# Patient Record
Sex: Female | Born: 1935 | Race: White | Hispanic: No | Smoking: Never smoker
Health system: Southern US, Community
[De-identification: ages and names within clinical notes are randomized; demographics above are authoritative.]

## PROBLEM LIST (undated history)

## (undated) DIAGNOSIS — R222 Localized swelling, mass and lump, trunk: Secondary | ICD-10-CM

## (undated) DIAGNOSIS — C801 Malignant (primary) neoplasm, unspecified: Secondary | ICD-10-CM

## (undated) DIAGNOSIS — N2889 Other specified disorders of kidney and ureter: Secondary | ICD-10-CM

## (undated) DIAGNOSIS — I499 Cardiac arrhythmia, unspecified: Secondary | ICD-10-CM

## (undated) DIAGNOSIS — I10 Essential (primary) hypertension: Secondary | ICD-10-CM

## (undated) HISTORY — PX: TONSILLECTOMY: SUR1361

## (undated) HISTORY — PX: APPENDECTOMY: SHX54

---

## 2002-06-05 ENCOUNTER — Other Ambulatory Visit: Admission: RE | Admit: 2002-06-05 | Discharge: 2002-06-05 | Payer: Self-pay | Admitting: Obstetrics and Gynecology

## 2005-01-19 ENCOUNTER — Observation Stay (HOSPITAL_COMMUNITY): Admission: EM | Admit: 2005-01-19 | Discharge: 2005-01-20 | Payer: Self-pay | Admitting: Emergency Medicine

## 2005-01-19 ENCOUNTER — Encounter (INDEPENDENT_AMBULATORY_CARE_PROVIDER_SITE_OTHER): Payer: Self-pay | Admitting: *Deleted

## 2007-09-29 DIAGNOSIS — I499 Cardiac arrhythmia, unspecified: Secondary | ICD-10-CM

## 2007-09-29 HISTORY — DX: Cardiac arrhythmia, unspecified: I49.9

## 2008-08-15 ENCOUNTER — Inpatient Hospital Stay (HOSPITAL_COMMUNITY): Admission: EM | Admit: 2008-08-15 | Discharge: 2008-08-20 | Payer: Self-pay | Admitting: Emergency Medicine

## 2008-08-15 ENCOUNTER — Ambulatory Visit: Payer: Self-pay | Admitting: Internal Medicine

## 2008-08-16 ENCOUNTER — Ambulatory Visit: Payer: Self-pay | Admitting: Surgery

## 2008-08-16 ENCOUNTER — Encounter (INDEPENDENT_AMBULATORY_CARE_PROVIDER_SITE_OTHER): Payer: Self-pay | Admitting: Internal Medicine

## 2008-09-03 ENCOUNTER — Ambulatory Visit: Payer: Self-pay | Admitting: Internal Medicine

## 2008-09-18 ENCOUNTER — Ambulatory Visit: Payer: Self-pay | Admitting: Internal Medicine

## 2008-09-18 LAB — CONVERTED CEMR LAB
ALT: 15 units/L (ref 0–35)
Albumin: 3.9 g/dL (ref 3.5–5.2)
Alkaline Phosphatase: 68 units/L (ref 39–117)
BUN: 13 mg/dL (ref 6–23)
Calcium: 9.3 mg/dL (ref 8.4–10.5)
Direct LDL: 98.2 mg/dL
GFR calc Af Amer: 127 mL/min
Potassium: 4.4 meq/L (ref 3.5–5.1)
Total CHOL/HDL Ratio: 3.9
Triglycerides: 232 mg/dL (ref 0–149)
VLDL: 46 mg/dL — ABNORMAL HIGH (ref 0–40)

## 2008-09-24 ENCOUNTER — Inpatient Hospital Stay (HOSPITAL_COMMUNITY): Admission: RE | Admit: 2008-09-24 | Discharge: 2008-09-25 | Payer: Self-pay | Admitting: Internal Medicine

## 2008-09-24 ENCOUNTER — Ambulatory Visit: Payer: Self-pay | Admitting: Internal Medicine

## 2008-09-24 HISTORY — PX: ABLATION OF DYSRHYTHMIC FOCUS: SHX254

## 2008-10-24 ENCOUNTER — Ambulatory Visit: Payer: Self-pay | Admitting: Internal Medicine

## 2009-06-02 DIAGNOSIS — R002 Palpitations: Secondary | ICD-10-CM | POA: Insufficient documentation

## 2009-06-02 DIAGNOSIS — I10 Essential (primary) hypertension: Secondary | ICD-10-CM | POA: Insufficient documentation

## 2009-06-02 DIAGNOSIS — I498 Other specified cardiac arrhythmias: Secondary | ICD-10-CM

## 2011-02-10 NOTE — Assessment & Plan Note (Signed)
[facility]                         ELECTROPHYSIOLOGY OFFICE NOTE   VALMA, ROTENBERG                       MRN:          [id number]  DATE:10/24/2008                            DOB:          Mar 09, 1935    Ms. Hardigree returns today for followup.  She is a very pleasant 76-year-  old woman with a history of hypertension and SVT who was subsequently  found at EP study to have a concealed left postural septal accessory  pathway contributing to her SVT.  This was successfully ablated  utilizing transseptal catheterization several weeks ago.  She returns  today for followup.  She has had no recurrent SVT though she does admit  to some palpitations particularly if she has had too much coffee to  drink.  The patient's blood pressure in our office is somewhat elevated  and has been in the past, but she states that she takes it at home and  at her sister's house where it is normal.  No other complaints today  except for occasional palpitations associated with her increased  caffeine intake.   The patient is on no medications at the present time.   Her physical exam is notable for her being a pleasant, well-appearing  woman in no distress.  Blood pressure was 160/90, the pulse was 80 and  regular, the respirations were 18, the weight was 152 pounds.  Neck  revealed no jugular distention.  Lungs are clear bilaterally to  auscultation.  No wheezing, rales, or rhonchi are present.  There is no  increased work of breathing.  Cardiovascular exam revealed a regular  rate and rhythm.  Normal S1 and S2.  There are no murmurs, rubs, or  gallops.  Abdominal exam is soft and nontender.  Extremities  demonstrated no edema.   EKG demonstrates sinus rhythm.  Normal axis and intervals.   IMPRESSION:  1. Supraventricular tachycardia status post successful catheter      ablation.  2. Rare palpitations associated with the caffeine intake, but with no      symptoms like  she had prior to her ablation.  3. Hypertension which is predominantly white coat related according to      the patient.   DISCUSSION:  Overall, Ms. Momon is stable.  I have recommended no  additional followup at this time in our EP office.  I will see the  patient back as needed.  I have encouraged her to reduce her salt intake  and seek followup if her blood pressure becomes more elevated.     Doylene Canning. Ladona Ridgel, MD  Electronically Signed    GWT/MedQ  DD: 10/24/2008  DT: 10/24/2008  Job #: [id number]

## 2011-02-10 NOTE — Op Note (Signed)
NAMESARI, Alicia Richardson                ACCOUNT NO.:  [id number]   MEDICAL RECORD NO.:  [id number]          PATIENT TYPE:  INP   LOCATION:  6525                         FACILITY:  [facility]   PHYSICIAN:  Doylene Canning. Ladona Ridgel, MD    DATE OF BIRTH:  May 03, 1935   DATE OF PROCEDURE:  08/17/2008  DATE OF DISCHARGE:                               OPERATIVE REPORT   PROCEDURE PERFORMED:  Electrophysiologic study and radiofrequency  catheter ablation of a concealed left posterior accessory pathway.  The  procedure was unsuccessful in getting rid of the accessory pathway and  inducible supraventricular tachycardia.   INTRODUCTION:  The patient is 76 years old.  She has had a several week  history of tachy palpitations and documented SVT associated with  syncope.  The patient was in her usual state of health when she  presented with a syncopal episode and was found to be in SVT at 180  beats per minute.  She had recurrent SVT despite medical therapy and is  now referred for catheter ablation of her SVT.   PROCEDURE:  After informed consent was obtained, the patient was taken  to the Diagnostic EP Lab in a fasting state.  After usual preparation  and draping, intravenous fentanyl and midazolam were given for sedation.  A 6-French hexapolar catheter was inserted percutaneously into the right  jugular vein and advanced to the coronary sinus.  A 5-French quadripolar  catheter was inserted percutaneously in the right femoral vein and  advanced to the RV apex.  A 5-French quadripolar catheter was inserted  percutaneously in the right femoral vein and advanced under fluoroscopic  guidance to the His bundle region.  After measurement with the catheter  manipulation, the patient developed sustained SVT.  PVCs replaced during  tachycardia at the time of His bundle refracture, which very clearly pre-  excited the atrium.  During ventricular pacing, there was nondecremental  VA conduction, which was eccentric.   Mapping of the atrial activation  sequence, both during tachycardia as well as with ventricular pacing  demonstrated that the earliest atrial activation was in a location  approximately 5 o'clock on the mitral valve annulus.  The activation  sequence was very clearly bracketed.  Over the next 3 hours, both B as  well as D-curve Cordis-Webster ablation catheter as well as a standard  curve EPT bidirectional catheter were utilized to attempt to ablate the  left posterior accessory pathway.  It should be noted that the atrial  insertion of the pathway could not be reached after multiple mapping  attempts.  As the catheter approached the atrial insertion of the  pathway, the catheter would dislodge off the mitral annulus and back  into the right ventricle and then out into the aorta.  Multiple attempts  at mapping the atrial insertion of the tachycardia were carried out both  by placing the ablation catheter farther into the left atrium and trying  to pull it back as well as trying to manipulate the catheter more  proximally along the mitral annulus and coronary sinus, but  unfortunately despite multiple attempts on  the atrial insertion of the  pathway, successful ablation could not be carried out.  Attempts to map  the ventricular insertion of the pathway were then carried out.  The  ablation catheter was able to get right on top of ventricular insertion  of the pathway, but with RF energy application on the ventricular  insertion, the power level could never be greater than 2 or 3 Watts.  This resulted in high temperatures and no clear-cut heating of tissue.  Despite multiple attempts on the ventricular insertion, accessory  pathway conduction persisted.  Finally, the ablation catheter was  maneuvered in the coronary sinus os and RF energy application was  carried out in the coronary sinus along the coronary sinus os.  This  demonstrated no accessory pathway block and no evidence of  successful  ablation.  At this point, the catheters were removed.  It was deemed  most appropriate at this point, a transseptal catheterization at a later  date would be most appropriate, and because the patient was already  therapeutically anticoagulated with heparin, it was deemed not  appropriate to proceed at this juncture secondary to the patient's  underlying anticoagulation.  At this point, the catheters were removed,  hemostasis was assured, and the patient was returned to her room in  satisfactory condition.   COMPLICATIONS:  There were no immediate procedure complications.   RESULTS:  a.  Baseline ECG.  The baseline ECG demonstrates sinus rhythm  with normal axis and intervals.  There was no ventricular pre-  excitation.  b.  Baseline intervals.  The HV interval was 45 milliseconds.  The QRS  duration was 90 milliseconds.  The AH interval was 95 seconds.  The QT  interval was 400 milliseconds.  b.  Rapid ventricular pacing.  Rapid ventricular pacing from the RV apex  demonstrated inducible tachycardia.  VA Wenckebach was demonstrated with  ventricular pacing at 310 milliseconds.  During ventricular pacing, the  atrial activation was eccentric and nondecremental.  d.  Programmed ventricular simulation.  Programmed ventricular  stimulation demonstrated nondecremental VA conduction, which was  eccentric.  e.  Rapid atrial pacing.  Rapid atrial pacing was carried out from the  coronary sinus and the high right atrium pace cycle length down to 310  milliseconds demonstrating AV Wenckebach.  During rapid atrial pacing,  the PR interval was less than the R interval, but there was inducible  SVT.  f.  Programmed atrial stimulation.  Programmed atrial stimulation was  carried out from the coronary sinus demonstrating no AH jumps, no echo  beats.  There was inducible tachycardia, however.  g.  Arrhythmias observed.  AV reentry tachycardia initiation was with  spontaneous as well as  with rapid atrial pacing.  Duration was  sustained.  Termination was with ventricular pacing and catheter  manipulation.  The cycle length was approximately 330 milliseconds.  h.  Mapping.  Mapping of the patient's tachycardia demonstrated earliest  atrial activation at approximately 5 o'clock on the mitral annulus.  1. RF energy application.  A total of 19 RF energy applications      delivered to the atrial and ventricular insertions of the      tachycardia.  Unfortunately, successful ablation could not be      carried out.   CONCLUSION:  Study demonstrates unsuccessful catheter ablation after  electrophysiologic study demonstrated a left posterior accessory pathway  and inducible AV reentry tachycardia.  The patient's procedure was  uncomplicated.  The patient is being placed on  flecainide and Lopressor  initially with plans to repeat catheter ablation utilizing a transseptal  catheterization with RF energy applications to the atrial insertion of  the tachycardia at a time in several weeks from now.      Doylene Canning. Ladona Ridgel, MD  Electronically Signed     GWT/MEDQ  D:  08/17/2008  T:  08/18/2008  Job:  [id number]

## 2011-02-10 NOTE — Letter (Signed)
October 24, 2008    L. Lupe Carney, M.D.  [address] [place], [place] [address]   RE:  EMERALD, SHOR  MRN:  [id number]  /  DOB:  [birth year]   Dear Dr. Clovis Riley:   This is a letter in followup on a mutual patient of ours, Ms. Alicia Richardson.  The patient is very pleasant 76 year old woman with a history  of tachy-palpitations and documented SVT.  I saw her initially back in  November to December range and she actually had syncope with her SVT.  We elected to undergo electrophysiologic study and catheter ablation at  that time and she was at that time found to have a very difficult  accessory pathway when approached by way of the aortic valve region.  In  attempts to a successfully ablate the tachycardia, which was located  along the mitral annulus (she has an accessory pathway there).  We were  unsuccessful by way of the retrograde aortic approach.  Having  demonstrated this, we had her come back in several weeks ago where she  underwent successful catheter ablation by way of a transseptal approach  crossing the right atrium into the left atrium by percutaneous puncture  of the atrial septum with advancement of her ablation catheter on to the  mitral annulus from this approach.  She underwent successful catheter  ablation.  She returns today for followup.  The patient has done well  since her ablation.  She notes that if she takes too much caffeine in  the way of drinking too much coffee she will feel palpitations, but she  has had no sustained tachy-palpitations or heart racing.   At this point, the likelihood of [name]'s SVT returning is quite  low.  I have recommend a period of watchful waiting.  While, I have not  scheduled her for followup visit.  I will be happy to see her back at  your discretion if you thinks it is appropriate.  Please do not hesitate  to contact me for any additional questions regarding [name].    Sincerely,      Doylene Canning. Ladona Ridgel,  MD  Electronically Signed    GWT/MedQ  DD: 10/24/2008  DT: 10/25/2008  Job #: [id number]

## 2011-02-10 NOTE — H&P (Signed)
Alicia Richardson, DEMARTINI                ACCOUNT NO.:  [id number]   MEDICAL RECORD NO.:  [id number]          PATIENT TYPE:  INP   LOCATION:  3703                         FACILITY:  [facility]   PHYSICIAN:  Peggye Pitt, M.D. DATE OF BIRTH:  [birth year]   DATE OF ADMISSION:  08/15/2008  DATE OF DISCHARGE:                              HISTORY & PHYSICAL   PRIMARY CARE PHYSICIAN:  She does not have one.  That makes her  unassigned to [place].   CHIEF COMPLAINT:  Syncope.   HISTORY OF PRESENT ILLNESS:  Mrs. Stoermer is a very young and healthy-  appearing 76 year old Caucasian woman, who does not receive any regular  medical care, who was with a friend this afternoon at a shopping center  leaning against a wall when she suddenly felt some fluttering in her  chest and then lost consciousness and slid down the wall, hitting her  right hip.  Her friend report this episode of loss of consciousness  lasted for about two minutes.  She then woke up and has had no further  symptoms since then.  She denies any chest pains, any shortness of  breath, or any other symptoms..  For this reason we were called for  admission.   ALLERGIES:  She has an intolerance to PENICILLIN which causes a yeast  infection.  I believe penicillin should be erased from her allergy list.   PAST MEDICAL HISTORY:  None.   HOME MEDICATIONS:  None.   SOCIAL HISTORY:  She denies any alcohol, tobacco, or illicit drug use.  She is divorced.  Retired.   FAMILY HISTORY:  Very positive for coronary artery disease with a  brother having a fatal MI in his 26s and both mother and father with  ischemic cardiomyopathy and Mis,.although she is not sure at what age  her parents had their first myocardial infarctions.   REVIEW OF SYSTEMS:  Negative except as per HPI.   PHYSICAL EXAMINATION:  VITAL SIGNS:  Upon admission blood pressure  182/96, heart rate 96, respirations 23, O2 saturations 96% on room air  with a temperature of 98.1.  GENERAL:  She is alert, awake, oriented x3.  Does not appear to be in  any acute distress.  HEENT:  Normocephalic, atraumatic.  Her pupils are equal, reactive to  light, and accommodation with intact extraocular movements.  NEC;  Supple.  No JVD.  No lymphadenopathy.  No bruits or goiter.  HEART:  Regular rate and rhythm with no murmurs, rubs, or gallops.  LUNGS:  Clear to auscultation bilaterally.  ABDOMEN:  Soft, nontender, nondistended with positive bowel sounds.  EXTREMITIES:  No edema.  Positive pulses.  NEUROLOGIC:  Her mental status is intact.  Her cranial nerves II-XII are  intact.  Muscle strength is 5/5 bilaterally and symmetric.  DTRs are 2+  and symmetric.  Her sensation is intact to light touch.  Her Babinski is  downgoing bilaterally.  I have not ambulated her.  Finger-to-nose is  normal.   LABORATORY DATA:  Upon admission sodium 143, potassium 4.4, chloride  106, bicarb 28, BUN 12, creatinine 0.82, glucose  of 118.  WBC 8.6,  hemoglobin 13.7, and platelets 191.  Her first set of point of care  markers is negative.   Chest x-ray shows no acute distress.  A CT of head without contrast  shows no acute abnormality.   ASSESSMENT/PLAN:  1. Her syncopal event which at this time to me is most concerning for      cardiac etiology, especially an arrhythmia such as paroxysmal      supraventricular tachycardia.  Will admit to telemetry.  Will cycle      EKGs and cardiac enzymes.  I doubt this is a CVA, given absence of      focal findings.  No seizure activity was noted by her friend.  Will      also check a UDS and a TSH.  Will also check a 2-D echo to rule out      aortic stenosis as a cause of the syncope.  Will also risk stratify      with fasting lipid panel and a hemoglobin A1c.  2. For prophylaxis while in the hospital the patient will have      Protonix for GI prophylaxis and on Lovenox for DVT prophylaxis.      Peggye Pitt, M.D.  Electronically Signed      EH/MEDQ  D:  08/15/2008  T:  08/16/2008  Job:  [id number]

## 2011-02-10 NOTE — Discharge Summary (Signed)
Alicia Richardson, Alicia Richardson                ACCOUNT NO.:  [id number]   MEDICAL RECORD NO.:  [id number]          PATIENT TYPE:  INP   LOCATION:  6525                         FACILITY:  [facility]   PHYSICIAN:  Doylene Canning. Ladona Ridgel, MD    DATE OF BIRTH:  11-29-1934   DATE OF ADMISSION:  08/15/2008  DATE OF DISCHARGE:  08/20/2008                               DISCHARGE SUMMARY   ALLERGIES:  This patient has an intolerance to PENICILLIN.   FINAL DIAGNOSES:  1. Admitted with supraventricular tachycardia with syncope.  2. Electrophysiology study on August 17, 2008.      a.     Inducible atrioventricular reentrant tachycardia.      b.     At electrophysiology study, left posterior accessory       pathway.      c.     Unable to terminate with radiofrequency catheter ablation.      d.     The patient will return for accessory pathway ablation using       a transseptal puncture for mapping of the left atrium where the       accessory pathway is located.  3. Started flecainide therapy and metoprolol therapy this admission.  4. Supraventricular tachycardia suppression.  Medication adjusted on      August 20, 2008, when minor fleeting breakthrough had occurred.  5. The patient goes home on flecainide 50 mg tablets 1-1/2 twice daily      and metoprolol 50 mg twice daily.  6. Dyslipidemia.  The patient will be sent home with a statin therapy      and possible follow up with fibrate (TriCor if HDL value does not      increase).  7. A 2-D echocardiogram on August 16, 2008, ejection fraction of 55-      60%, no left ventricular wall motion abnormalities, heart size is      normal.  8. Carotid duplex.  No internal carotid artery stenoses.   BRIEF HISTORY:  Ms. Willbanks is a 76 year old female.  She did not receive  any regular medical care.  She has no primary caregiver.  She has not  had any cardiac history.   She was shopping with a friend on the afternoon of August 15, 2008,  when she suddenly felt some  fluttering in her chest, her eyes widened  according to witness' report, she stiffened and then she slid down along  the wall, lost consciousness for brief seconds it seems.  Upon  awakening, she was not disoriented, she had no postictal symptoms.  She  denied chest pain or shortness of breath.  She was taken to the  emergency room for evaluation.  The patient was transferred to [facility] into the emergency room.  Cardiac enzymes were taken and  they were 0.04, then 0.01, then 0.01.  She did have a run of wide  complex tachycardia, which did spontaneously terminate and was thought  to be supraventricular with aberration, and because of this, cardiology  consult was obtained.  A 2-D echocardiogram and carotid Dopplers were  taken.  The echocardiogram showed ejection fraction 55-60%, carotid  Doppler showed no internal carotid artery stenosis.  The patient did  mention family history of myocardial infarction in a brother at a fairly  young age.  The troponin I studies were negative.  Her cholesterol was  237, LDL cholesterol 172, HDL cholesterol 39, and triglycerides 129.  On  August 17, 2008, the patient was seen by Dr. Lewayne Bunting,  Electrophysiology.  He discussed both medical therapy and ablation  therapy.  The patient has a history of syncope when in SVT and would  rather have an ablation.  This was done the same day on August 17, 2008.  At electrophysiology study, AVRT was recognized.  There was a  concealed left posterior accessory pathway.  Radiofrequency energy was  applied, but it was unable to ablate this rhythm.  The patient was then  started on medical therapy and kept over the weekend to assess the  effects of her medical therapy during the 36 hours prior to this  discharge while the patient has been on antiarrhythmic of flecainide.  She has had no ventricular arrhythmias.  She did have a mild recurrence  of her SVT in the morning of August 20, 2008, and was  perceived as a  very mild fluttering.  The episodes only lasted between 10-20 seconds  and there were only 3 of them, which self-terminated.  The patient's  medications were adjusted prior to discharge by Dr. Ladona Ridgel.   The patient was now discharged on the following medications:  1. Flecainide 50 mg tablets 1-1/2 tablets twice daily.  2. Zocor 20 mg daily at bedtime.  3. Metoprolol 50 mg twice daily.  All of these medications are new.  She has prescriptions for all of  these.   She has followup with Dr. Ladona Ridgel at [facility] [address] on Monday, September 03, 2008, at 1:15.  Discussion of a  transseptal puncture for mapping the LA and ablation of the accessory  pathway will be entertained at that time.  Complete blood count this  admission on August 19, 2008, hemoglobin 11.9, hematocrit 35.3, white  cell 11.1, platelets of 152.  Serum electrolytes on August 20, 2008,  sodium 143, potassium 3.7, chloride 109, carbonate 27, BUN is 13,  creatinine is 0.75, glucose is 92.  Liver function panel this admission  alkaline phosphatase 53, SGOT is 17, SGPT is 14.  Magnesium this  admission is 2.2.  Urinalysis is negative.  TSH is 3.640.  Hgb A1c is  5.8.   Once again, the patient will follow up with Dr. Ladona Ridgel on Thursday,  September 03, 2008.      Maple Mirza, PA      Doylene Canning. Ladona Ridgel, MD  Electronically Signed    GM/MEDQ  D:  08/20/2008  T:  08/20/2008  Job:  [id number]

## 2011-02-10 NOTE — Discharge Summary (Signed)
Alicia Richardson, Alicia Richardson                ACCOUNT NO.:  [id number]   MEDICAL RECORD NO.:  [id number]          PATIENT TYPE:  INP   LOCATION:  4733                         FACILITY:  [facility]   PHYSICIAN:  Doylene Canning. Ladona Ridgel, MD    DATE OF BIRTH:  [birth year]   DATE OF ADMISSION:  09/24/2008  DATE OF DISCHARGE:  09/25/2008                               DISCHARGE SUMMARY   FINAL DIAGNOSES:  1. Recurrent supraventricular tachycardia with syncope.  2. 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  3. Attempted radiofrequency catheter ablations on August 17, 2008,      utilizing the retrograde aortic approach, this was unsuccessful.  4. Radiofrequency catheter ablation using transseptal approach with      echocardiographic guidance through the fossa ovalis/confirmed      placement by venography.  Radiofrequency catheter ablation placed      at 5:30 on the mitral valve annulus terminating supraventricular      tachycardia this admission.  5. A 2-D echocardiogram, this study was done on August 16, 2008,      ejection fraction 55-60%.  No left ventricular regional wall motion      abnormalities.  6. Strong family history of coronary artery disease.  A brother had a      myocardial infarction at a fairly young age.  7. Mild dyslipidemia.  8. Fatigue, on metoprolol.   PROCEDURE:  On September 24, 2008, electrophysiology study,  radiofrequency catheter ablation of SVT using transseptal approach with  echocardiographic guidance and venography.  Multiple radiofrequency  applications were delivered in the atrial region on the left side and  final termination was obtained by maneuvering the catheterization into  the ventricular insertion of the pathway.  Radiofrequency catheter  ablation was applied at the ventricular side of the accessory pathway in  the LAO projection.  There was no persistence of accessory pathway  conduction and no inducible SVT after delivery of radiofrequency  catheter energy.   BRIEF HISTORY:  Alicia Richardson is a 76 year old female.  She has a history  of tachy palpitations.  She has SVT which was associated with syncope in  the past.  She underwent an attempted radiofrequency catheter ablation  for this in November 2009.  As mentioned above, a retrograde aortic  approach was attempted and this was unsuccessful.  The patient was  maintained on medical therapy, but has continuing fatigue on metoprolol  and some recurrence.  These recurrences have been short lived.  However,  the patient had been fatigued and weak with medication therapy which  included metoprolol 50 mg twice daily and flecainide 75 mg twice daily.  The patient wishes to pursue followup ablation if possible.  This could  involve a transseptal approach with intracardiac echocardiographic  guidance.  The risks, benefits, goals, and expectations of the procedure  have been discussed with the patient and she wishes to proceed.   HOSPITAL COURSE:  The patient presents electively on September 24, 2008.  She underwent a transseptal approach with successful ablation of the  concealed  left posterior accessory pathway.  The patient has had no  recurrence of SVT after the procedure.  She is able to discharge without  resuming flecainide and she will discontinue metoprolol 50 mg twice  daily, either tapering off gently or stopping altogether depending on  how she feels.  This was an option given to her by Dr. Ladona Ridgel.  She then  continues on simvastatin 20 mg daily at bedtime.  She has an office  visit with Dr. Ladona Ridgel on Wednesday, October 24, 2008, at 8:45 at [facility] office.  Laboratory studies for this admission  were drawn on September 18, 2008, sodium 142,  potassium 4.4, chloride 107, carbonate 30, glucose 97, BUN is 13, and  creatinine 0.6.  Total cholesterol is 175, triglycerides is 232, HDL   cholesterol 46, and LDL cholesterol 98.  Complete blood count is not  available on the chart at this time.      Maple Mirza, PA      Doylene Canning. Ladona Ridgel, MD  Electronically Signed    GM/MEDQ  D:  09/25/2008  T:  09/25/2008  Job:  [id number]   cc:   Doylene Canning. Ladona Ridgel, MD

## 2011-02-10 NOTE — Assessment & Plan Note (Signed)
[facility]                         ELECTROPHYSIOLOGY OFFICE NOTE   Alicia Richardson, Alicia Richardson                       MRN:          [id number]  DATE:09/03/2008                            DOB:          1935-08-03    Alicia Richardson returns today for followup.  She is a very pleasant 76-year-  old woman with a history of recurrent SVT associated with syncope.  The  patient underwent electrophysiologic study and attempted catheter  ablation on August 17, 2008.  At that time, she was found to have a  left posterior accessory pathway and unusual anatomy and it was very  difficult to cross the aortic valve (no history of aortic stenosis) and  attempts to ablate her pathway were unsuccessful.  Ablation attempts  using the retrograde aortic approach with placement of a catheter onto  the left atrial insertion, the pathway resulted in the catheter  dislodging right at the site of early atrial activation and inability to  maintain catheter contact with the pathway from the atrial insertion.  From the ventricular insertion, placement of the catheter was also quite  difficult, but once the catheter had been placed right on the pathway,  it was essentially continuous VA activation during tachycardia.  RF  energy application failed to terminate the tachycardia as the power  output on the ablation catheter was only 1 or 2 watts and effective  energy could not be delivered to the tissue.  It was deemed most likely  that this was related to low blood flow state in the area under the  mitral annulus.  Irrigation was not available during the procedure at  this time.  With these results and after several hours of attempts with  multiple catheters, the decision to terminate the procedure was made and  the patient was discharged the following day on beta-blockers and  flecainide at low dose.  Since then, she has been stable, having only a  couple of episodes of SVT which were short  lived.  Unfortunately, she  notes that she feels fatigue and weak on the medications.  She returns  today for followup and wishes to proceed with redo catheter ablation.   CURRENT MEDICATIONS:  1. Flecainide 75 mg twice daily.  2. Metoprolol 50 mg twice daily.  3. Simvastatin 20 mg daily.   PHYSICAL EXAMINATION:  GENERAL:  She is a pleasant well-appearing, 76-  year-old woman in no distress.  VITAL SIGNS:  Blood pressure was 150/80, the pulse is 57 and regular,  the respirations were 18, and the weight was 152 pounds.  NECK:  No jugular vein distention.  LUNGS:  Clear bilaterally to auscultation.  No wheezes, rales, or  rhonchi present.  CARDIOVASCULAR:  Regular rate and rhythm.  Normal S1 and S2.  ABDOMEN:  Soft and nontender.  EXTREMITIES:  No edema.   EKG demonstrates sinus rhythm (sinus bradycardia with normal axis and  intervals).  QRS duration was 100 milliseconds.   IMPRESSION:  1. Recurrent supraventricular tachycardia despite medical therapy with      the initial episodes associated with syncope.  2. Status post failed  catheter ablation attempts of a left posterior      concealed accessory pathway.   DISCUSSION:  I have discussed the treatment options with the patient.  One option would be to continue medical therapy.  However, she is not in  favor of this because of the fatigue and weakness associated with  medical therapy.  Second option would be redo catheter ablation  utilizing a transseptal approach with intracardiac echo guidance.  The  risks, benefits, goals, and expectations of the procedure have been  discussed with the patient.  She would like to proceed with catheter  ablation.  This was scheduled as early as possible convenient time.     Doylene Canning. Ladona Ridgel, MD  Electronically Signed    GWT/MedQ  DD: 09/03/2008  DT: 09/04/2008  Job #: [id number]

## 2011-02-10 NOTE — Op Note (Signed)
Alicia Richardson, DISCH                ACCOUNT NO.:  [id number]   MEDICAL RECORD NO.:  [id number]          PATIENT TYPE:  INP   LOCATION:  4733                         FACILITY:  [facility]   PHYSICIAN:  Doylene Canning. Ladona Ridgel, MD    DATE OF BIRTH:  11-26-1934   DATE OF PROCEDURE:  09/24/2008  DATE OF DISCHARGE:                               OPERATIVE REPORT   PROCEDURE PERFORMED:  Electrophysiologic study and radiofrequency  catheter ablation of atrioventricular reentrant tachycardia utilizing a  concealed left posteroseptal accessory pathway utilizing intracardiac  echocardiogram and transseptal catheterization.   INTRODUCTION:  The patient is a 76 year old, has a history of  tachypalpitations and documented SVT associated with syncope.  She  underwent attempted catheter ablation and several weeks ago was found to  have a concealed left posteroseptal accessory pathway.  Because of  anatomic constraints, the patient's pathway could not be successfully  ablated.  The patient's pathway was targeted by the retrograde aortic  approach.  The patient was placed on flecainide initially and is now  referred for repeat catheter ablation.   PROCEDURE:  After informed consent was obtained, the patient was taken  to the diagnostic EP lab in the fasting state.  After usual preparation  and draping, intravenous fentanyl and midazolam was given for sedation.  A 6-French quadripolar catheter was inserted percutaneously into the  left femoral vein and advanced to the right ventricle.  An 11-French  intracardiac echo catheter was inserted percutaneously through the left  femoral vein and advanced to the right atrium.  A 6-French hexapolar  catheter was inserted percutaneously into the right femoral vein and  advanced to the His-bundle region.  After measurement of basic  intervals, rapid ventricular pacing was carried out demonstrating  eccentric atrial activation and inducible SVT.  Additional pacing  demonstrated a VA Wenckebach cycle length of 360 milliseconds.  Following this, programmed stimulation was carried out from the coronary  sinus demonstrating midline ventricular activation and inducible SVT.  Cycle length of tachycardia was approximately 400 milliseconds.  During  tachycardia, PVCs were placed at the time of His-bundle refractures  demonstrating preexcitation of the atrium and ventricular pacing  demonstrated a AV conduction sequence.  With reconfirmation of  persistence of the patient's left posteroseptal accessory pathway, the 8-  French transseptal catheter (SL2) along with a Brockenbrough needle were  advanced into the right atrium.  Utilized intracardiac echo guidance,  the transseptal catheter was placed onto the fossa ovalis of the right  atrium and successful transseptal catheterization was carried out.  With  venography of the left atrium demonstrating that the catheter was in  fact in place.  The 8-French sheath was subsequently advanced into the  left atrium over the Brockenbrough needle and the 7-French quadripolar  ablation catheter was subsequently advanced by way of the transseptal  sheath into the left atrium.  If she noted that with successful  transseptal catheterization, 5000 units of heparin was injected through  the transseptal sheath.  Following this, repeat ACTs were obtained  during the procedure and heparin was infused as deemed appropriate.  Typically, the  ACT range between 250 and 300 during the procedure.  At  this point, ventricular pacing was carried out at 500 milliseconds  demonstrating persistence of eccentric activation.  This is the earliest  at a position approximately 500-530 in the LAO projection along the  mitral valve annulus.  The quadripolar ablation catheter was then  advanced first onto the atrial insertion and mapping was carried out  along the mitral valve annulus.  Multiple RF energy applications were  delivered and during RF  energy application there were be transient VA  dissociation indicative of successful ablation.  However, unfortunately  with RF energy application discontinued, the patient's accessory pathway  conduction would immediately returned.  At this point, the ablation  catheter was maneuvered onto the ventricular insertion of the pathway.  Mapping was made more difficult because there was such a small atrial  signal with the catheter on the ventricular insertion.  However, on the  ventricular side of the accessory pathway in the LAO projection, a  single RF energy application was delivered despite a very small atrial  signal and there was immediate VA dissociation present with RF energy  application.  The RF energy application was then carried out for a total  of 2 minutes in this location and the patient was observed for 20  minutes.  During this time, there was no longer any persistence of  accessory pathway conduction and no inducible SVT.  The catheters were  removed, the sheaths were secured and the patient was taken to the  holding area for sheath removal once the ACT dropped below 175 seconds.   COMPLICATIONS:  There no immediate procedure complications.   RESULTS:  A. Baseline ECG.  Baseline ECG demonstrates sinus rhythm with  normal axis and intervals.  B.  Baseline intervals.  The sinus node cycle length was 900  milliseconds.  The HV interval was 46 milliseconds.  The QRS duration  was 98 milliseconds.  The following ablation, there were no significant  changes in these intervals.  C.  Rapid ventricular pacing.  Rapid atrial pacing after ablation  demonstrated VA dissociation.  Prior to ablation, rapid atrial pacing  demonstrated eccentric, non-decremental atrial activation.  D.  Programmed ventricular stimulation.  Programmed ventricular  stimulation was carried out at base drive cycle length of 161  milliseconds with the S1-S2 interval stepwise decreased down to 400  milliseconds  resulting an initiation of SVT.  Following ablation,  programmed ventricular stimulation demonstrated VA dissociation.  E.  Rapid atrial pacing.  Rapid pacing was carried out from the coronary  sinus and high right atrium base drive cycle length of 096 milliseconds  and stepwise decreased to 400 milliseconds where AV Wenckebach was  observed.  During rapid atrial pacing, the PR interval was less than the  RR interval and there was no inducible SVT after ablation.  F.  Programmed atrial stimulation.  Programmed atrial stimulation was  carried out from the coronary sinus and high right atrium base drive  cycle length of 045 milliseconds.  The S1-S2 interval stepwise decreased  down to 340 milliseconds where the AV node ERP was observed.  During  programmed atrial stimulation, there were no AH jumps, no echo beats, no  inducible SVT following ablation.  G.  Arrhythmias observed.  1. AV reentrant tachycardia initiation was with rapid atrial and      ventricular pacing as well as spontaneous.  The duration was      sustained.  The termination was spontaneous.  a.     Mapping.  Mapping of the patient's accessory pathway       conduction demonstrated the accessory pathway to be at the       location about 5:30 on the mitral valve annulus in the LAO       projection.  This was a successful site of catheter ablation.      b.     RF energy application.  Total of 8 RF energy applications       were delivered to the atrial as well as ventricular insertion of       the accessory pathway resulting in the pathway being rendered       nonfunctional and resulting in [place] dissociation and no inducible       SVT following ablation.   CONCLUSION:  This study demonstrates successful electrophysiologic study  and RF catheter ablation of AV reentrant tachycardia utilizing a left  posteroseptal accessory pathway, utilizing intracardiac echo and  transseptal catheterization.  As previous attempt utilizing  retrograde  aortic approach had been unsuccessful.      Doylene Canning. Ladona Ridgel, MD  Electronically Signed     GWT/MEDQ  D:  09/24/2008  T:  09/25/2008  Job:  [id number]

## 2011-02-10 NOTE — Consult Note (Signed)
Alicia Richardson, TWEED NO.:  [id number]   MEDICAL RECORD NO.:  [id number]          PATIENT TYPE:  INP   LOCATION:  3703                         FACILITY:  [facility]   PHYSICIAN:  [name], MD DATE OF BIRTH:  19-Apr-1935   DATE OF CONSULTATION:  08/16/2008  DATE OF DISCHARGE:                                 CONSULTATION   REQUESTING:  Dr. Orvan Falconer.   She has no cardiologist.   CHIEF COMPLAINT:  Syncope.   HISTORY OF PRESENT ILLNESS:  This is a 77 year old white female with no  past medical history here with syncope.  The patient states that she was  walking out the mall when she felt palpitations and dizziness and was  witnessed to collapse.  She woke up without symptoms including chest  pain.  She denies any previous episodes of syncope.  She has otherwise  been in her usual state of health maintaining an active lifestyle.  The  patient was admitted for an evaluation for syncope and during which  telemetry recorded that a few runs of wide and narrow complex  tachycardias.  During this time she experienced palpitations, some mild  dizziness but no chest pain or shortness of breath, presyncope or  syncope.   PAST MEDICAL HISTORY:  None.   ALLERGIES:  PENICILLIN.   MEDICATIONS:  None.  Here she is taking enoxaparin prophylactic dose,  Protonix and Lopressor 12.5 twice daily.   SOCIAL HISTORY:  She is divorced.  She is retired.  She does not smoke.   FAMILY HISTORY:  Significant for early coronary disease.   REVIEW OF SYSTEMS:  All 14 systems were reviewed were negative except as  mentioned in the HPI.   PHYSICAL EXAMINATION:  Blood pressure was 166/90 at the time of the  episode, otherwise it has been in the 140s over the 80s.  Pulse is 72  and regular.  She has been afebrile.  She is satting 98% on room air.  Generally she is a 76 year old white female appearing stated age.  No  acute distress.  HEENT:  Moist mucous membranes.  Pupils equal,  round, react to light  accommodation.  Anicteric sclera.  NECK:  No jugular venous distention.  No thyromegaly.  No carotid  bruits.  CARDIOVASCULAR:  Regular rate and rhythm.  No murmurs, rubs or gallops.  LUNGS:  Clear to auscultation bilaterally.  ABDOMEN:  Nontender, nondistended.  Positive bowel sounds.  No masses.  EXTREMITIES:  No clubbing, cyanosis, edema, 2+ pulses throughout.  SKIN:  Warm, dry and intact.  No rashes.  NEURO:  Alert, oriented x3.  Cranial nerves II-XII are grossly intact.  No focal neurologic deficits.  PSYCH:  Mood and affect are appropriate.   RADIOLOGY:  She had a normal head CT and a chest x-ray showing no acute  cardiopulmonary process.  EKG immediately following her tachycardia  showed a rate of 72 with a normal sinus rhythm.  No ST or T-wave changes  suggestive of ischemia.  It is a normal EKG.   LABS:  White count is 7.3, hematocrit 37.2, platelet count is  178,  potassium is 3.8.  Her creatinine is 0.8.  TSH is 3.6.  Hemoglobin A1c  is 5.8.  Her cardiac enzymes so far have been unremarkable.  Lipid  profile:  Total cholesterol is 237, triglycerides 129, HDL 39 and LDL is  172.   ASSESSMENT/PLAN:  This is a 76 year old white female with no past  medical history here with syncope and found to have an supraventricular  tachycardia.  1. Supraventricular tachycardia.  This is very unlikely ventricular      tachycardia in the setting of known structural heart disease and no      signs of ischemia.  Her echo today showed a normal ejection      fraction and a normal left atrial size.  In this context this is      likely atrio-ventricular node reentry tachycardia with aberrancy.      She did have a run of narrow complex tachycardia which is in the      same axis as her native rhythm.  These are very short and mildly      symptomatic.  However, if this persists please start Cardizem 30 mg      every 6 hours.  We will consider further treatment such as an  atrio-      ventricular node reentry tachycardia ablation.  Otherwise, continue      monitoring on telemetry.  2. Hypertension.  She may benefit from additional blood pressure      management including hydrochlorothiazide or an angiotensin      converting enzyme inhibitor.   1. Hyperlipidemia.  Her cholesterol profile is not at goal.  I      counseled her on dietary management and if this persists then      please consider starting statin medication.      [name], MD  Electronically Signed     BHH/MEDQ  D:  08/17/2008  T:  08/17/2008  Job:  [id number]

## 2011-02-13 NOTE — Op Note (Signed)
NAMEBRONDA, Alicia Richardson NO.:  [id number]   MEDICAL RECORD NO.:  [id number]          PATIENT TYPE:  EMS   LOCATION:  ED                           FACILITY:  [facility]   PHYSICIAN:  Sandria Bales. Ezzard Standing, M.D.  DATE OF BIRTH:  02/09/1935   DATE OF PROCEDURE:  01/19/2005  DATE OF DISCHARGE:                                 OPERATIVE REPORT   PREOPERATIVE DIAGNOSIS:  Appendicitis.   POSTOPERATIVE DIAGNOSIS:  Focal ruptured appendicitis.   PROCEDURE:  Laparoscopic appendectomy.   SURGEON:  Sandria Bales. Ezzard Standing, M.D.   ANESTHESIA:  General endotracheal.   ESTIMATED BLOOD LOSS:  Minimal.   PROCEDURE:  Ms. Colton has had a 24-hour history of increasing abdominal  pain which has localized to the right lower quadrant.  She saw Dr. Arrie Aran and obtained a CT scan of her abdomen at [facility], which was  consistent with appendicitis.  She now comes in for attempted laparoscopic  appendectomy.   OPERATIVE NOTE:  The indications and potential complications were explained  to the patient.  The potential complications include but are not limited to  bleeding, infection, the possibility of open surgery or bowel injury.  She  and her sister were at her bedside when I talked to her about this, and I  think she understands this well.   OPERATION:  Patient in the supine position with the arm tucked to her side.  A Foley catheter in place.  Before I made an incision, she went into  paroxysmal atrial tachycardia/atrial flutter.  This responded to IV  medications and carotid massage, supervised by Dr. Alanda Slim.   I then prepped the abdomen with Betadine solution and sterilely draped the  abdomen.  She had a Foley catheter in place.  Her left arm was tucked.  I  went through an infraumbilical incision with sharp dissection carried down  to the abdominal cavity.  A 0 degree 10 mg laparoscope was inserted.  The  right and left lobes of the liver were unremarkable.  The stomach was  unremarkable.  The bowel was acutely inflamed in the right lower quadrant,  consistent with a locally perforated appendicitis.  These adhesions were  taken down.  There was a small amount of pus, which was obtained, and  culture sent for aerobic and anaerobic cultures.   I then placed two additional trocars in the right subcostal, a 5 mm trocar,  and a 10 mm left lower quadrant trocar.  I then grabbed the base of the  appendix.  I dissected the appendix with a harmonic scalpel down at the base  of the appendix.  I then used the blue-load of the Endo GIA 45 mm stapler  and fired this across the base of the appendix.  I then placed the appendix  in an EndoCatch bag and delivered it through the umbilicus.  I then  reinspected the appendiceal stump, which looked good.  I reinspected the  appendiceal mesentery, which looked good.  I irrigated the abdomen out with  two liters of saline, trying to get any kind of loose  debris, and actually,  I found an appendicolith, and this was removed.   After irrigating the abdomen, I then removed the trocars and turned.  There  was no bleeding of the trocar site.  The umbilical port was closed with a 0  Vicryl suture.  The skin at each site was closed with a 5-0 Monocryl suture  and painted with tincture of Benzoin, Steri-Strips, and sterilely dressed.   The patient tolerated the procedure well and was transported to the recovery  room in good condition.  Sponge and needle count were correct at the end of  the case.      DHN/MEDQ  D:  01/19/2005  T:  01/19/2005  Job:  [id number]   cc:   Lacretia Leigh. Quintella Reichert, M.D.

## 2011-02-13 NOTE — H&P (Signed)
Alicia Richardson, Alicia Richardson NO.:  [id number]   MEDICAL RECORD NO.:  [id number]          PATIENT TYPE:  EMS   LOCATION:  ED                           FACILITY:  [facility]   PHYSICIAN:  Sandria Bales. Ezzard Standing, M.D.  DATE OF BIRTH:  [birth year]   DATE OF ADMISSION:  01/19/2005  DATE OF DISCHARGE:                                HISTORY & PHYSICAL   HISTORY OF ILLNESS:  This is a 76 year old white female who is referred by  Dr. Arrie Aran from [facility], who developed abdominal pain  which began yesterday, localized to the right lower quadrant.  She had a CT  scan done today at [facility], which was consistent with appendicitis.   She denies any history of peptic ulcer disease, liver disease, pancreatic  disease, or colon disease.  She has had no prior abdominal surgery.  Again,  her pain is worsened, to be localized in the right lower quadrant.  She has  had some nausea with this.   She does not see a physician on a regular basis.   PAST MEDICAL HISTORY:   ALLERGIES:  She has no allergies.   MEDICATIONS:  She is on no medications.   PRIMARY CARE PHYSICIAN:  She actually has no regular doctor that she sees.   REVIEW OF SYSTEMS:  NEUROLOGIC:  No history of seizure or loss of  consciousness.  PULMONARY:  Does not smoke cigarettes.  No history of pneumonia or  tuberculosis.  CARDIAC:  No history of heart disease, chest pain, or hypertension.  GASTROINTESTINAL:  Again, see history of present illness.  UROLOGIC:  No kidney stones or kidney infections.  GYN:  She has never been pregnant.   PERSONAL HISTORY:  She works as a Scientist, physiological at [employer].  She is divorced and has no children.  Her sister was with her in the holding  area of the OR.   PHYSICAL EXAMINATION:  VITAL SIGNS:  Temperature is 101.2, blood pressure  173/88, pulse 120, respirations 20.  Saturations are 95%.  GENERAL:  She is a well-nourished, pleasant white female, alert and  cooperative.  HEENT:  Unremarkable.  NECK:  Supple without mass, without thyromegaly.  LUNGS:  Clear to auscultation.  HEART:  She is tachycardic without murmur.  ABDOMEN:  She has decreased bowel sounds.  She is tender with guarding in  her right lower quadrant.  I feel no abdominal mass.  She has no evidence of  hernia.  RECTAL:  I did not do a rectal exam.  EXTREMITIES:  She has good strength in all 4 extremities.  NEUROLOGIC:  Grossly intact.   X-rays were reviewed.  I did review her abdominal CT scan with Winferd Humphrey,  and it does appear to be consistent with appendicitis, though she does have  some thickened loops of her terminal ileum.   DIAGNOSIS:  Appendicitis.   PLAN:  I discussed with the patient laparoscopic removal of the appendix,  and that it usually takes 1-2 hours to do the surgery.  The risks include  bleeding, infection, perforation of the bowel, and  the possibility of open  surgery, and her hospital stay would be dependent on the degree of  inflammation of the appendix and how she does.      DHN/MEDQ  D:  01/19/2005  T:  01/19/2005  Job:  [id number]

## 2011-07-01 LAB — DIFFERENTIAL
Basophils Absolute: 0
Monocytes Absolute: 0.5
Monocytes Relative: 6

## 2011-07-01 LAB — CBC
HCT: 35.3 — ABNORMAL LOW
HCT: 37.2
MCHC: 32.3
MCV: 89.5
Platelets: 152
Platelets: 178
RBC: 4.73
RDW: 13.7

## 2011-07-01 LAB — RAPID URINE DRUG SCREEN, HOSP PERFORMED: Amphetamines: NOT DETECTED

## 2011-07-01 LAB — BASIC METABOLIC PANEL
BUN: 12
BUN: 13
CO2: 27
CO2: 27
Chloride: 109
Creatinine, Ser: 0.82
GFR calc Af Amer: >60
GFR calc non Af Amer: >60
Glucose, Bld: 104 — ABNORMAL HIGH
Glucose, Bld: 118 — ABNORMAL HIGH
Glucose, Bld: 92
Potassium: 3.6
Potassium: 3.7
Sodium: 143

## 2011-07-01 LAB — COMPREHENSIVE METABOLIC PANEL
ALT: 14
Albumin: 3.5
Alkaline Phosphatase: 53
CO2: 28
Chloride: 104
Glucose, Bld: 86
Sodium: 139

## 2011-07-01 LAB — CARDIAC PANEL(CRET KIN+CKTOT+MB+TROPI)
CK, MB: 0.9
CK, MB: 1
Relative Index: 0.6
Relative Index: 0.7
Relative Index: 0.7
Total CK: 132
Total CK: 150
Troponin I: 0.01

## 2011-07-01 LAB — URINALYSIS, ROUTINE W REFLEX MICROSCOPIC
Bilirubin Urine: NEGATIVE
Hgb urine dipstick: NEGATIVE
pH: 7

## 2011-07-01 LAB — LIPID PANEL
Cholesterol: 237 — ABNORMAL HIGH
HDL: 39 — ABNORMAL LOW
LDL Cholesterol: 172 — ABNORMAL HIGH

## 2011-07-01 LAB — TSH: TSH: 3.64

## 2011-07-01 LAB — POCT CARDIAC MARKERS: Troponin i, poc: <0.05

## 2011-07-01 LAB — HEMOGLOBIN A1C: Hgb A1c MFr Bld: 5.8

## 2011-07-01 LAB — URINE MICROSCOPIC-ADD ON

## 2011-07-03 LAB — CBC
MCHC: 32.3 g/dL (ref 30.0–36.0)
Platelets: 141 10*3/uL — ABNORMAL LOW (ref 150–400)
RBC: 4.65 MIL/uL (ref 3.87–5.11)

## 2011-07-03 LAB — APTT: aPTT: 28 seconds (ref 24–37)

## 2014-03-13 ENCOUNTER — Encounter (INDEPENDENT_AMBULATORY_CARE_PROVIDER_SITE_OTHER): Payer: Medicare HMO | Admitting: Surgery

## 2018-02-07 ENCOUNTER — Encounter (HOSPITAL_BASED_OUTPATIENT_CLINIC_OR_DEPARTMENT_OTHER): Payer: Self-pay | Admitting: *Deleted

## 2018-02-07 ENCOUNTER — Other Ambulatory Visit: Payer: Self-pay | Admitting: Surgery

## 2018-02-08 ENCOUNTER — Other Ambulatory Visit: Payer: Self-pay

## 2018-02-08 ENCOUNTER — Encounter (HOSPITAL_BASED_OUTPATIENT_CLINIC_OR_DEPARTMENT_OTHER): Payer: Self-pay | Admitting: *Deleted

## 2018-02-10 NOTE — H&P (Signed)
Alicia Richardson Documented: 02/07/2018 2:10 PM Location: [facility] Patient #: [phone] DOB: 1935-04-26 Divorced / Language: Cleophus Molt / Race: White Female   History of Present Illness (Kennet Mccort A. Ninfa Linden MD; 02/07/2018 2:49 PM) The patient is a 83 year old female who presents with a complaint of Mass. This patient is referred by Dr. Harriett Sine for evaluation of a large right upper back mass. The patient reports that the mass is been present for several years. It is now getting larger and causing her to have pain and discomfort with activity. It is also so large that she has difficulty sleeping at night secondary to discomfort. She denies trauma to the area. She denies any drainage from the area. She is otherwise without complaints.   Past Surgical History [facility], LPN; 04/09/4579 9:98 PM) Appendectomy  Tonsillectomy   Diagnostic Studies History (Hadelyn Massenburg, LPN; 3/38/2505 3:97 PM) Mammogram  >3 years ago  Allergies [facility], LPN; 6/73/4193 7:90 PM) No Known Drug Allergies [02/07/2018]:  Medication History (Hadelyn Massenburg, LPN; 2/40/9735 3:29 PM) Lisinopril (5MG  Tablet, Oral) Active. Potassium (99MG  Tablet, Oral) Active.  Family History (Hadelyn Massenburg, LPN; 06/21/2682 4:19 PM) Diabetes Mellitus  Mother. Heart Disease  Brother, Mother.  Pregnancy / Birth History Harriet Pho, LPN; 03/19/2978 8:92 PM) Age of menopause  75-50 Gravida  0 Irregular periods  Para  0  Other Problems (Hadelyn Massenburg, LPN; 10/16/4172 0:81 PM) High blood pressure     Review of Systems (Hadelyn Massenburg LPN; 4/48/1856 3:14 PM) General Not Present- Appetite Loss, Chills, Fatigue, Fever, Night Sweats, Weight Gain and Weight Loss. Skin Not Present- Change in Wart/Mole, Dryness, Hives, Jaundice, New Lesions, Non-Healing Wounds, Rash and Ulcer. HEENT Not Present- Earache, Hearing Loss, Hoarseness, Nose Bleed, Oral Ulcers,  Ringing in the Ears, Seasonal Allergies, Sinus Pain, Sore Throat, Visual Disturbances, Wears glasses/contact lenses and Yellow Eyes. Respiratory Not Present- Bloody sputum, Chronic Cough, Difficulty Breathing, Snoring and Wheezing. Breast Not Present- Breast Mass, Breast Pain, Nipple Discharge and Skin Changes. Cardiovascular Not Present- Chest Pain, Difficulty Breathing Lying Down, Leg Cramps, Palpitations, Rapid Heart Rate, Shortness of Breath and Swelling of Extremities. Gastrointestinal Not Present- Abdominal Pain, Bloating, Bloody Stool, Change in Bowel Habits, Chronic diarrhea, Constipation, Difficulty Swallowing, Excessive gas, Gets full quickly at meals, Hemorrhoids, Indigestion, Nausea, Rectal Pain and Vomiting. Female Genitourinary Not Present- Frequency, Nocturia, Painful Urination, Pelvic Pain and Urgency. Musculoskeletal Not Present- Back Pain, Joint Pain, Joint Stiffness, Muscle Pain, Muscle Weakness and Swelling of Extremities. Neurological Not Present- Decreased Memory, Fainting, Headaches, Numbness, Seizures, Tingling, Tremor, Trouble walking and Weakness. Psychiatric Not Present- Anxiety, Bipolar, Change in Sleep Pattern, Depression, Fearful and Frequent crying. Endocrine Not Present- Cold Intolerance, Excessive Hunger, Hair Changes, Heat Intolerance, Hot flashes and New Diabetes. Hematology Not Present- Blood Thinners, Easy Bruising, Excessive bleeding, Gland problems, HIV and Persistent Infections.   Physical Exam (Leaf Kernodle A. Ninfa Linden MD; 02/07/2018 2:50 PM) The physical exam findings are as follows: Note:On examination, there is a large 8 cm right upper back mass. It is soft and nonpulsatile. There are no skin changes. It is not mobile. Lungs are clear bilaterally Cardiovascular is regular rate and rhythm Generally she is well appearance.    Assessment & Plan (Coumba Kellison A. Ninfa Linden MD; 02/07/2018 2:51 PM) MASS ON BACK (R22.2) Impression: Because of the size of this mass, the  patient's symptoms, and the potential for malignancy, excision of the right upper back mass is strongly recommended. Again is approximately 8 cm in size. She would like  to be removed. I discussed the risks of surgery which includes but is not limited to bleeding, infection, injury to surrounding structures, seroma formation, the need for further surgery if malignancy is found, recurrence, postoperative recovery, etc. She understands and wishes to proceed with surgery

## 2018-02-11 ENCOUNTER — Ambulatory Visit (HOSPITAL_BASED_OUTPATIENT_CLINIC_OR_DEPARTMENT_OTHER): Payer: Medicare Other | Admitting: Anesthesiology

## 2018-02-11 ENCOUNTER — Encounter (HOSPITAL_BASED_OUTPATIENT_CLINIC_OR_DEPARTMENT_OTHER): Payer: Self-pay | Admitting: *Deleted

## 2018-02-11 ENCOUNTER — Ambulatory Visit (HOSPITAL_BASED_OUTPATIENT_CLINIC_OR_DEPARTMENT_OTHER)
Admission: RE | Admit: 2018-02-11 | Discharge: 2018-02-11 | Disposition: A | Discharge status: Home / Self Care | Payer: Medicare Other | Source: Ambulatory Visit | Attending: Surgery | Admitting: Surgery

## 2018-02-11 ENCOUNTER — Other Ambulatory Visit: Payer: Self-pay

## 2018-02-11 ENCOUNTER — Encounter (HOSPITAL_BASED_OUTPATIENT_CLINIC_OR_DEPARTMENT_OTHER)
Admission: RE | Disposition: A | Discharge status: Home / Self Care | Payer: Self-pay | Source: Ambulatory Visit | Attending: Surgery

## 2018-02-11 DIAGNOSIS — I10 Essential (primary) hypertension: Secondary | ICD-10-CM | POA: Diagnosis not present

## 2018-02-11 DIAGNOSIS — Z79899 Other long term (current) drug therapy: Secondary | ICD-10-CM | POA: Diagnosis not present

## 2018-02-11 DIAGNOSIS — R222 Localized swelling, mass and lump, trunk: Secondary | ICD-10-CM | POA: Diagnosis present

## 2018-02-11 DIAGNOSIS — M5489 Other dorsalgia: Secondary | ICD-10-CM | POA: Insufficient documentation

## 2018-02-11 HISTORY — DX: Cardiac arrhythmia, unspecified: I49.9

## 2018-02-11 HISTORY — PX: MASS EXCISION: SHX2000

## 2018-02-11 HISTORY — DX: Localized swelling, mass and lump, trunk: R22.2

## 2018-02-11 SURGERY — EXCISION MASS
Anesthesia: Monitor Anesthesia Care | Site: Back | Laterality: Right

## 2018-02-11 MED ORDER — LACTATED RINGERS IV SOLN
INTRAVENOUS | Status: DC
  Administered 2018-02-11: 10:00:00 via INTRAVENOUS

## 2018-02-11 MED ORDER — FENTANYL CITRATE (PF) 100 MCG/2ML IJ SOLN: 50.0000 ug | INTRAMUSCULAR | Status: DC | PRN

## 2018-02-11 MED ORDER — SCOPOLAMINE 1 MG/3DAYS TD PT72: 1.0000 | MEDICATED_PATCH | Freq: Once | TRANSDERMAL | Status: DC | PRN

## 2018-02-11 MED ORDER — PROPOFOL 500 MG/50ML IV EMUL
INTRAVENOUS | Status: DC | PRN
Start: 2018-02-11 — End: 2018-02-11
  Administered 2018-02-11: 50 ug/kg/min via INTRAVENOUS

## 2018-02-11 MED ORDER — MIDAZOLAM HCL 2 MG/2ML IJ SOLN: 1.0000 mg | INTRAMUSCULAR | Status: DC | PRN

## 2018-02-11 MED ORDER — MEPERIDINE HCL 25 MG/ML IJ SOLN: 6.2500 mg | INTRAMUSCULAR | Status: DC | PRN

## 2018-02-11 MED ORDER — PROMETHAZINE HCL 25 MG/ML IJ SOLN: 6.2500 mg | INTRAMUSCULAR | Status: DC | PRN

## 2018-02-11 MED ORDER — GABAPENTIN 300 MG PO CAPS
300.0000 mg | ORAL_CAPSULE | ORAL | Status: AC
  Administered 2018-02-11: 300 mg via ORAL

## 2018-02-11 MED ORDER — MIDAZOLAM HCL 2 MG/2ML IJ SOLN
INTRAMUSCULAR | Status: AC
  Filled 2018-02-11: qty 2

## 2018-02-11 MED ORDER — DEXAMETHASONE SODIUM PHOSPHATE 10 MG/ML IJ SOLN
INTRAMUSCULAR | Status: DC | PRN
  Administered 2018-02-11: 10 mg via INTRAVENOUS

## 2018-02-11 MED ORDER — BUPIVACAINE-EPINEPHRINE (PF) 0.25% -1:200000 IJ SOLN
INTRAMUSCULAR | Status: AC
  Filled 2018-02-11: qty 30

## 2018-02-11 MED ORDER — CHLORHEXIDINE GLUCONATE CLOTH 2 % EX PADS: 6.0000 | MEDICATED_PAD | Freq: Once | CUTANEOUS | Status: DC

## 2018-02-11 MED ORDER — TRAMADOL HCL 50 MG PO TABS: 50.0000 mg | ORAL_TABLET | Freq: Four times a day (QID) | ORAL | 0 refills | Status: DC | PRN

## 2018-02-11 MED ORDER — ONDANSETRON HCL 4 MG/2ML IJ SOLN
INTRAMUSCULAR | Status: DC | PRN
  Administered 2018-02-11: 4 mg via INTRAVENOUS

## 2018-02-11 MED ORDER — LIDOCAINE-EPINEPHRINE (PF) 1 %-1:200000 IJ SOLN
INTRAMUSCULAR | Status: AC
  Filled 2018-02-11: qty 30

## 2018-02-11 MED ORDER — CEFAZOLIN SODIUM-DEXTROSE 2-4 GM/100ML-% IV SOLN
2.0000 g | INTRAVENOUS | Status: AC
  Administered 2018-02-11: 2 g via INTRAVENOUS

## 2018-02-11 MED ORDER — LIDOCAINE HCL (PF) 1 % IJ SOLN
INTRAMUSCULAR | Status: DC | PRN
  Administered 2018-02-11: 10 mL

## 2018-02-11 MED ORDER — FENTANYL CITRATE (PF) 100 MCG/2ML IJ SOLN: 25.0000 ug | INTRAMUSCULAR | Status: DC | PRN

## 2018-02-11 MED ORDER — GABAPENTIN 300 MG PO CAPS
ORAL_CAPSULE | ORAL | Status: AC
  Filled 2018-02-11: qty 1

## 2018-02-11 MED ORDER — LIDOCAINE HCL (PF) 1 % IJ SOLN
INTRAMUSCULAR | Status: AC
  Filled 2018-02-11: qty 30

## 2018-02-11 MED ORDER — FENTANYL CITRATE (PF) 100 MCG/2ML IJ SOLN
INTRAMUSCULAR | Status: DC | PRN
  Administered 2018-02-11 (×2): 50 ug via INTRAVENOUS

## 2018-02-11 MED ORDER — ACETAMINOPHEN 500 MG PO TABS
ORAL_TABLET | ORAL | Status: AC
  Filled 2018-02-11: qty 2

## 2018-02-11 MED ORDER — CEFAZOLIN SODIUM-DEXTROSE 2-4 GM/100ML-% IV SOLN
INTRAVENOUS | Status: AC
  Filled 2018-02-11: qty 100

## 2018-02-11 MED ORDER — LIDOCAINE HCL (CARDIAC) PF 100 MG/5ML IV SOSY
PREFILLED_SYRINGE | INTRAVENOUS | Status: AC
  Filled 2018-02-11: qty 5

## 2018-02-11 MED ORDER — BUPIVACAINE-EPINEPHRINE (PF) 0.5% -1:200000 IJ SOLN
INTRAMUSCULAR | Status: AC
  Filled 2018-02-11: qty 30

## 2018-02-11 MED ORDER — ACETAMINOPHEN 500 MG PO TABS
1000.0000 mg | ORAL_TABLET | ORAL | Status: AC
  Administered 2018-02-11: 1000 mg via ORAL

## 2018-02-11 MED ORDER — LIDOCAINE 2% (20 MG/ML) 5 ML SYRINGE
INTRAMUSCULAR | Status: DC | PRN
  Administered 2018-02-11: 50 mg via INTRAVENOUS

## 2018-02-11 MED ORDER — FENTANYL CITRATE (PF) 100 MCG/2ML IJ SOLN
INTRAMUSCULAR | Status: AC
  Filled 2018-02-11: qty 2

## 2018-02-11 SURGICAL SUPPLY — 44 items
ADH SKN CLS APL DERMABOND .7 (GAUZE/BANDAGES/DRESSINGS) ×1
BLADE CLIPPER SURG (BLADE) IMPLANT
BLADE HEX COATED 2.75 (ELECTRODE) ×3 IMPLANT
BLADE SURG 15 STRL LF DISP TIS (BLADE) ×1 IMPLANT
BLADE SURG 15 STRL SS (BLADE) ×3
CANISTER SUCT 1200ML W/VALVE (MISCELLANEOUS) IMPLANT
CHLORAPREP W/TINT 26ML (MISCELLANEOUS) ×3 IMPLANT
COVER BACK TABLE 60X90IN (DRAPES) ×3 IMPLANT
COVER MAYO STAND STRL (DRAPES) ×3 IMPLANT
DECANTER SPIKE VIAL GLASS SM (MISCELLANEOUS) IMPLANT
DERMABOND ADVANCED (GAUZE/BANDAGES/DRESSINGS) ×2
DERMABOND ADVANCED .7 DNX12 (GAUZE/BANDAGES/DRESSINGS) ×2 IMPLANT
DRAPE LAPAROTOMY 100X72 PEDS (DRAPES) ×3 IMPLANT
DRAPE UTILITY XL STRL (DRAPES) ×3 IMPLANT
ELECT REM PT RETURN 9FT ADLT (ELECTROSURGICAL) ×3
ELECTRODE REM PT RTRN 9FT ADLT (ELECTROSURGICAL) ×1 IMPLANT
GLOVE BIO SURGEON STRL SZ 6.5 (GLOVE) ×1 IMPLANT
GLOVE BIO SURGEONS STRL SZ 6.5 (GLOVE) ×1
GLOVE BIOGEL PI IND STRL 7.0 (GLOVE) IMPLANT
GLOVE BIOGEL PI INDICATOR 7.0 (GLOVE) ×2
GLOVE SURG SIGNA 7.5 PF LTX (GLOVE) ×3 IMPLANT
GOWN STRL REUS W/ TWL LRG LVL3 (GOWN DISPOSABLE) ×1 IMPLANT
GOWN STRL REUS W/ TWL XL LVL3 (GOWN DISPOSABLE) ×1 IMPLANT
GOWN STRL REUS W/TWL LRG LVL3 (GOWN DISPOSABLE) ×3
GOWN STRL REUS W/TWL XL LVL3 (GOWN DISPOSABLE) ×3
NDL HYPO 25X1 1.5 SAFETY (NEEDLE) ×1 IMPLANT
NEEDLE HYPO 25X1 1.5 SAFETY (NEEDLE) ×3 IMPLANT
NS IRRIG 1000ML POUR BTL (IV SOLUTION) IMPLANT
PACK BASIN DAY SURGERY FS (CUSTOM PROCEDURE TRAY) ×3 IMPLANT
PENCIL BUTTON HOLSTER BLD 10FT (ELECTRODE) ×3 IMPLANT
SLEEVE SCD COMPRESS KNEE MED (MISCELLANEOUS) IMPLANT
SPONGE LAP 4X18 RFD (DISPOSABLE) ×3 IMPLANT
SUT MNCRL AB 4-0 PS2 18 (SUTURE) ×2 IMPLANT
SUT VIC AB 2-0 SH 27 (SUTURE)
SUT VIC AB 2-0 SH 27XBRD (SUTURE) IMPLANT
SUT VIC AB 3-0 SH 27 (SUTURE) ×3
SUT VIC AB 3-0 SH 27X BRD (SUTURE) IMPLANT
SYR BULB 3OZ (MISCELLANEOUS) IMPLANT
SYR CONTROL 10ML LL (SYRINGE) ×3 IMPLANT
TOWEL OR 17X24 6PK STRL BLUE (TOWEL DISPOSABLE) ×3 IMPLANT
TOWEL OR NON WOVEN STRL DISP B (DISPOSABLE) ×3 IMPLANT
TUBE CONNECTING 20'X1/4 (TUBING)
TUBE CONNECTING 20X1/4 (TUBING) IMPLANT
YANKAUER SUCT BULB TIP NO VENT (SUCTIONS) IMPLANT

## 2018-02-11 NOTE — Discharge Instructions (Signed)
Ok to shower starting tomorrow  No vigorous activity for one week  Expect swelling  Ice pack, tylenol, ibuprofen also for pain  **Last Dose of 1000 MG Tylenol at 9:22AM  Post Anesthesia Home Care Instructions  Activity: Get plenty of rest for the remainder of the day. A responsible individual must stay with you for 24 hours following the procedure.  For the next 24 hours, DO NOT: -Drive a car -Paediatric nurse -Drink alcoholic beverages -Take any medication unless instructed by your physician -Make any legal decisions or sign important papers.  Meals: Start with liquid foods such as gelatin or soup. Progress to regular foods as tolerated. Avoid greasy, spicy, heavy foods. If nausea and/or vomiting occur, drink only clear liquids until the nausea and/or vomiting subsides. Call your physician if vomiting continues.  Special Instructions/Symptoms: Your throat may feel dry or sore from the anesthesia or the breathing tube placed in your throat during surgery. If this causes discomfort, gargle with warm salt water. The discomfort should disappear within 24 hours.  If you had a scopolamine patch placed behind your ear for the management of post- operative nausea and/or vomiting:  1. The medication in the patch is effective for 72 hours, after which it should be removed.  Wrap patch in a tissue and discard in the trash. Wash hands thoroughly with soap and water. 2. You may remove the patch earlier than 72 hours if you experience unpleasant side effects which may include dry mouth, dizziness or visual disturbances. 3. Avoid touching the patch. Wash your hands with soap and water after contact with the patch.

## 2018-02-11 NOTE — Anesthesia Preprocedure Evaluation (Addendum)
Anesthesia Evaluation  Patient identified by MRN, date of birth, ID band Patient awake    Reviewed: Allergy & Precautions, NPO status , Patient's Chart, lab work & pertinent test results  Airway Mallampati: II  TM Distance: >3 FB Neck ROM: Full    Dental  (+) Dental Advisory Given, Caps   Pulmonary neg pulmonary ROS,    Pulmonary exam normal breath sounds clear to auscultation       Cardiovascular hypertension, Pt. on medications + dysrhythmias  Rhythm:Regular Rate:Normal + Systolic murmurs    Neuro/Psych negative neurological ROS  negative psych ROS   GI/Hepatic negative GI ROS, Neg liver ROS,   Endo/Other  negative endocrine ROS  Renal/GU negative Renal ROS     Musculoskeletal negative musculoskeletal ROS (+)   Abdominal   Peds  Hematology negative hematology ROS (+)   Anesthesia Other Findings   Reproductive/Obstetrics negative OB ROS                            Anesthesia Physical Anesthesia Plan  ASA: II  Anesthesia Plan: MAC   Post-op Pain Management:    Induction: Intravenous  PONV Risk Score and Plan:   Airway Management Planned: Simple Face Mask  Additional Equipment:   Intra-op Plan:   Post-operative Plan:   Informed Consent: I have reviewed the patients History and Physical, chart, labs and discussed the procedure including the risks, benefits and alternatives for the proposed anesthesia with the patient or authorized representative who has indicated his/her understanding and acceptance.   Dental advisory given  Plan Discussed with: CRNA  Anesthesia Plan Comments:        Anesthesia Quick Evaluation

## 2018-02-11 NOTE — Anesthesia Postprocedure Evaluation (Signed)
Anesthesia Post Note  Patient: Alicia Richardson  Procedure(s) Performed: EXCISION RIGHT UPPER BACK  MASS (Right Back)     Patient location during evaluation: PACU Anesthesia Type: MAC Level of consciousness: awake and alert Pain management: pain level controlled Vital Signs Assessment: post-procedure vital signs reviewed and stable Respiratory status: spontaneous breathing Cardiovascular status: stable Anesthetic complications: no    Last Vitals:  Vitals:   02/11/18 1130 02/11/18 1145  BP: (!) 195/87 (!) 187/79  Pulse:  76  Resp:  16  Temp:  36.6 C  SpO2:  97%    Last Pain:  Vitals:   02/11/18 1145  TempSrc:   PainSc: 0-No pain                 Nolon Nations

## 2018-02-11 NOTE — Op Note (Signed)
EXCISION RIGHT UPPER BACK  MASS  Procedure Note  GERALDYN SHAIN 02/11/2018   Pre-op Diagnosis: Right upper back mass     Post-op Diagnosis: same  Procedure(s): EXCISION RIGHT UPPER BACK  MASS (8 cm)  Surgeon(s): Coralie Keens, MD  Anesthesia: Monitor Anesthesia Care  Staff:  Circulator: Maurene Capes, RN Scrub Person: Lorenza Burton, CST  Estimated Blood Loss: Minimal               Specimens: sent to path  Findings: The large right upper back mass appeared consistent with a large lipoma  Procedure: The patient was brought to the operating room and identified as the correct patient.  She was placed upon the operating room table and then turned to the left lateral decubitus position.  Her right upper back was then prepped and draped in the usual sterile fashion.  I anesthetized the skin over the top of the large mass with lidocaine.  I made a small incision with a scalpel.  I took this right into the subcutaneous with electrocautery.  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  It was approximately 8 cm in size.  It was easily removed in its entirety and sent to pathology for evaluation.  Hemostasis appeared to be achieved with the cautery.  I then closed the subcutaneous tissue with interrupted 3-0 Vicryl sutures and closed the skin with a running 4-0 Monocryl.  Dermabond was then applied.  The patient tolerated the procedure well.  All the counts were correct at the end the procedure.  The patient was then taken in a stable condition to the recovery room.          Ronon Ferger A   Date: 02/11/2018  Time: 10:43 AM

## 2018-02-11 NOTE — Transfer of Care (Signed)
Immediate Anesthesia Transfer of Care Note  Patient: PHUNG KOTAS  Procedure(s) Performed: EXCISION RIGHT UPPER BACK  MASS (Right Back)  Patient Location: PACU  Anesthesia Type:MAC  Level of Consciousness: awake, alert  and oriented  Airway & Oxygen Therapy: Patient Spontanous Breathing and Patient connected to face mask oxygen  Post-op Assessment: Report given to RN and Post -op Vital signs reviewed and stable  Post vital signs: Reviewed and stable  Last Vitals:  Vitals Value Taken Time  BP 170/80 02/11/2018 10:47 AM  Temp 37 C 02/11/2018 10:48 AM  Pulse 73 02/11/2018 10:51 AM  Resp 14 02/11/2018 10:51 AM  SpO2 96 % 02/11/2018 10:51 AM  Vitals shown include unvalidated device data.  Last Pain:  Vitals:   02/11/18 1048  TempSrc:   PainSc: 0-No pain      Patients Stated Pain Goal: 3 (28/78/67 6720)  Complications: No apparent anesthesia complications

## 2018-02-11 NOTE — Interval H&P Note (Signed)
History and Physical Interval Note: no change in H and P  02/11/2018 10:11 AM  Alicia Richardson  has presented today for surgery, with the diagnosis of Right upper back mass  The various methods of treatment have been discussed with the patient and family. After consideration of risks, benefits and other options for treatment, the patient has consented to  Procedure(s): EXCISION RIGHT [place] (Right) as a surgical intervention .  The patient's history has been reviewed, patient examined, no change in status, stable for surgery.  I have reviewed the patient's chart and labs.  Questions were answered to the patient's satisfaction.     Alicia Richardson A

## 2018-02-14 ENCOUNTER — Encounter (HOSPITAL_BASED_OUTPATIENT_CLINIC_OR_DEPARTMENT_OTHER): Payer: Self-pay | Admitting: Surgery

## 2018-09-22 ENCOUNTER — Other Ambulatory Visit: Payer: Self-pay

## 2018-09-22 ENCOUNTER — Emergency Department (HOSPITAL_COMMUNITY): Payer: Medicare Other

## 2018-09-22 ENCOUNTER — Observation Stay (HOSPITAL_COMMUNITY)
Admission: EM | Admit: 2018-09-22 | Discharge: 2018-09-25 | Disposition: A | Discharge status: Home / Self Care | Payer: Medicare Other | Attending: Family Medicine | Admitting: Family Medicine

## 2018-09-22 ENCOUNTER — Encounter (HOSPITAL_COMMUNITY): Payer: Self-pay | Admitting: Emergency Medicine

## 2018-09-22 DIAGNOSIS — R410 Disorientation, unspecified: Secondary | ICD-10-CM | POA: Diagnosis present

## 2018-09-22 DIAGNOSIS — Z8249 Family history of ischemic heart disease and other diseases of the circulatory system: Secondary | ICD-10-CM | POA: Diagnosis not present

## 2018-09-22 DIAGNOSIS — Z880 Allergy status to penicillin: Secondary | ICD-10-CM | POA: Insufficient documentation

## 2018-09-22 DIAGNOSIS — S0101XA Laceration without foreign body of scalp, initial encounter: Secondary | ICD-10-CM | POA: Insufficient documentation

## 2018-09-22 DIAGNOSIS — W1830XA Fall on same level, unspecified, initial encounter: Secondary | ICD-10-CM | POA: Diagnosis not present

## 2018-09-22 DIAGNOSIS — G9341 Metabolic encephalopathy: Secondary | ICD-10-CM | POA: Insufficient documentation

## 2018-09-22 DIAGNOSIS — N390 Urinary tract infection, site not specified: Secondary | ICD-10-CM | POA: Diagnosis present

## 2018-09-22 DIAGNOSIS — S0003XA Contusion of scalp, initial encounter: Secondary | ICD-10-CM

## 2018-09-22 DIAGNOSIS — I10 Essential (primary) hypertension: Secondary | ICD-10-CM | POA: Diagnosis not present

## 2018-09-22 DIAGNOSIS — S4991XA Unspecified injury of right shoulder and upper arm, initial encounter: Secondary | ICD-10-CM

## 2018-09-22 DIAGNOSIS — S43101A Unspecified dislocation of right acromioclavicular joint, initial encounter: Secondary | ICD-10-CM | POA: Diagnosis not present

## 2018-09-22 DIAGNOSIS — I471 Supraventricular tachycardia: Secondary | ICD-10-CM | POA: Diagnosis not present

## 2018-09-22 LAB — COMPREHENSIVE METABOLIC PANEL
ALT: 23 U/L (ref 0–44)
ANION GAP: 9 (ref 5–15)
AST: 36 U/L (ref 15–41)
Albumin: 3.5 g/dL (ref 3.5–5.0)
Alkaline Phosphatase: 58 U/L (ref 38–126)
BILIRUBIN TOTAL: 2 mg/dL — AB (ref 0.3–1.2)
BUN: 20 mg/dL (ref 8–23)
CALCIUM: 8.9 mg/dL (ref 8.9–10.3)
CO2: 28 mmol/L (ref 22–32)
Chloride: 103 mmol/L (ref 98–111)
Creatinine, Ser: 0.89 mg/dL (ref 0.44–1.00)
GFR calc Af Amer: >60 mL/min (ref >60)
Glucose, Bld: 122 mg/dL — ABNORMAL HIGH (ref 70–99)
POTASSIUM: 3.7 mmol/L (ref 3.5–5.1)
Sodium: 140 mmol/L (ref 135–145)
Total Protein: 6.9 g/dL (ref 6.5–8.1)

## 2018-09-22 LAB — CBC WITH DIFFERENTIAL/PLATELET
Abs Immature Granulocytes: 0.05 10*3/uL (ref 0.00–0.07)
Basophils Absolute: 0 10*3/uL (ref 0.0–0.1)
Basophils Relative: 0 %
EOS PCT: 0 %
Eosinophils Absolute: 0 10*3/uL (ref 0.0–0.5)
HEMATOCRIT: 40.7 % (ref 36.0–46.0)
Hemoglobin: 12.7 g/dL (ref 12.0–15.0)
Immature Granulocytes: 0 %
LYMPHS ABS: 2.2 10*3/uL (ref 0.7–4.0)
Lymphocytes Relative: 18 %
MCH: 28.1 pg (ref 26.0–34.0)
MCHC: 31.2 g/dL (ref 30.0–36.0)
MCV: 90 fL (ref 80.0–100.0)
MONO ABS: 1.1 10*3/uL — AB (ref 0.1–1.0)
MONOS PCT: 9 %
Neutro Abs: 8.5 10*3/uL — ABNORMAL HIGH (ref 1.7–7.7)
Neutrophils Relative %: 73 %
Platelets: 166 10*3/uL (ref 150–400)
RBC: 4.52 MIL/uL (ref 3.87–5.11)
RDW: 13.9 % (ref 11.5–15.5)
WBC: 11.8 10*3/uL — ABNORMAL HIGH (ref 4.0–10.5)
nRBC: 0 % (ref 0.0–0.2)

## 2018-09-22 LAB — ETHANOL

## 2018-09-22 LAB — CK: Total CK: 569 U/L — ABNORMAL HIGH (ref 38–234)

## 2018-09-22 MED ORDER — SODIUM CHLORIDE 0.9 % IV BOLUS
500.0000 mL | Freq: Once | INTRAVENOUS | Status: AC
  Administered 2018-09-23: 500 mL via INTRAVENOUS

## 2018-09-22 MED ORDER — TETANUS-DIPHTH-ACELL PERTUSSIS 5-2.5-18.5 LF-MCG/0.5 IM SUSP
0.5000 mL | Freq: Once | INTRAMUSCULAR | Status: AC
  Administered 2018-09-23: 0.5 mL via INTRAMUSCULAR
  Filled 2018-09-22: qty 0.5

## 2018-09-22 NOTE — ED Notes (Signed)
Patient transported to X-ray 

## 2018-09-22 NOTE — ED Notes (Signed)
Pt's wound cleaned on head. No abrasion noted. Bruising noted.

## 2018-09-22 NOTE — ED Notes (Signed)
ED Provider at bedside. 

## 2018-09-22 NOTE — ED Provider Notes (Signed)
[facility] EMERGENCY DEPARTMENT Provider Note   CSN: [id number] Arrival date & time: 09/22/18  2116     History   Chief Complaint Chief Complaint  Patient presents with  . Fall    HPI Alicia Richardson is a 83 y.o. female.  HPI Patient lives alone.  She is a difficult historian.  Thinks she may have fallen today.  Does not remember the event.  Her sister has been trying to contact her for several days.  Neighbor went over and found the patient's house was disorganized and a strong smell of urine.  Patient complaining of pain to the right side of her head and her right shoulder.  States she is stopped taking all of her medication is only taking multivitamins. Past Medical History:  Diagnosis Date  . Dysrhythmia 2009   SVT-ablation by Dr Lovena Le 09-24-08  . Mass on back     Patient Active Problem List   Diagnosis Date Noted  . Acute lower UTI 09/23/2018  . Delirium 09/23/2018  . Injury of right acromioclavicular joint 09/23/2018  . Essential hypertension 06/02/2009  . SUPRAVENTRICULAR TACHYCARDIA 06/02/2009  . PALPITATIONS 06/02/2009    Past Surgical History:  Procedure Laterality Date  . ABLATION OF DYSRHYTHMIC FOCUS  09/24/2008   by Dr Lovena Le for SVT  . APPENDECTOMY    . MASS EXCISION Right 02/11/2018   Procedure: EXCISION RIGHT UPPER BACK  MASS;  Surgeon: Coralie Keens, MD;  Location: [place];  Service: General;  Laterality: Right;  . TONSILLECTOMY       OB History   No obstetric history on file.      Home Medications    Prior to Admission medications   Medication Sig Start Date End Date Taking? Authorizing Provider  amLODipine (NORVASC) 10 MG tablet Take 1 tablet (10 mg total) by mouth at bedtime. 09/25/18 10/25/18  Mariel Aloe, MD  cefdinir (OMNICEF) 300 MG capsule Take 1 capsule (300 mg total) by mouth 2 (two) times daily for 4 days. 09/26/18 09/30/18  Mariel Aloe, MD    Family History Family History    Problem Relation Age of Onset  . Kidney disease Mother   . Heart attack Brother     Social History Social History   Tobacco Use  . Smoking status: Never Smoker  . Smokeless tobacco: Never Used  Substance Use Topics  . Alcohol use: Yes    Comment: social wine  . Drug use: Never     Allergies   Penicillins   Review of Systems Review of Systems  Constitutional: Negative for fever.  Eyes: Negative for visual disturbance.  Respiratory: Negative for shortness of breath.   Cardiovascular: Negative for chest pain.  Gastrointestinal: Negative for abdominal pain, nausea and vomiting.  Genitourinary: Negative for dysuria, flank pain and frequency.  Musculoskeletal: Positive for arthralgias. Negative for back pain and neck pain.  Skin: Positive for wound.  Neurological: Positive for headaches. Negative for dizziness and weakness.  Psychiatric/Behavioral: Positive for confusion.  All other systems reviewed and are negative.    Physical Exam Updated Vital Signs BP (!) 189/99 (BP Location: Right Arm)   Pulse 80   Temp 98.2 F (36.8 C) (Oral)   Resp 18   Ht 5\' 2"  (1.575 m)   Wt 68 kg   SpO2 98%   BMI 27.44 kg/m   Physical Exam Vitals signs and nursing note reviewed.  Constitutional:      Appearance: Normal appearance. She is well-developed.  HENT:     Head: Normocephalic.     Comments: Patient has contusion with dried blood to the right temporal region of her scalp.  Midface is stable.  No malocclusion.    Nose: Nose normal.     Mouth/Throat:     Mouth: Mucous membranes are moist.  Eyes:     Extraocular Movements: Extraocular movements intact.     Pupils: Pupils are equal, round, and reactive to light.     Comments: No posterior midline cervical tenderness to palpation.  Neck:     Musculoskeletal: Normal range of motion and neck supple. No neck rigidity or muscular tenderness.  Cardiovascular:     Rate and Rhythm: Normal rate and regular rhythm.     Heart  sounds: No murmur. No friction rub. No gallop.   Pulmonary:     Effort: Pulmonary effort is normal. No respiratory distress.     Breath sounds: Normal breath sounds. No stridor. No wheezing, rhonchi or rales.  Chest:     Chest wall: No tenderness.  Abdominal:     General: Bowel sounds are normal.     Palpations: Abdomen is soft.     Tenderness: There is no abdominal tenderness. There is no guarding or rebound.  Musculoskeletal: Normal range of motion.        General: No tenderness.     Comments: No midline thoracic or lumbar tenderness.  No CVA tenderness.  Pelvis stable.  Patient has bruising and tenderness to palpation over the right shoulder.  Pain with range of motion.  Patient has full range of motion of the right elbow and wrist.  Distal pulses are 2+.  Lymphadenopathy:     Cervical: No cervical adenopathy.  Skin:    General: Skin is warm and dry.     Findings: No erythema or rash.  Neurological:     General: No focal deficit present.     Mental Status: She is alert and oriented to person, place, and time.     Comments: 5/5 motor in all extremities.  Sensation fully intact.  Psychiatric:        Behavior: Behavior normal.      ED Treatments / Results  Labs (all labs ordered are listed, but only abnormal results are displayed) Labs Reviewed  URINE CULTURE - Abnormal; Notable for the following components:      Result Value   Culture >=100,000 COLONIES/mL ESCHERICHIA COLI (*)    Organism ID, Bacteria ESCHERICHIA COLI (*)    All other components within normal limits  CBC WITH DIFFERENTIAL/PLATELET - Abnormal; Notable for the following components:   WBC 11.8 (*)    Neutro Abs 8.5 (*)    Monocytes Absolute 1.1 (*)    All other components within normal limits  COMPREHENSIVE METABOLIC PANEL - Abnormal; Notable for the following components:   Glucose, Bld 122 (*)    Total Bilirubin 2.0 (*)    All other components within normal limits  URINALYSIS, ROUTINE W REFLEX MICROSCOPIC  - Abnormal; Notable for the following components:   Color, Urine AMBER (*)    APPearance CLOUDY (*)    Hgb urine dipstick SMALL (*)    Ketones, ur 5 (*)    Protein, ur 30 (*)    Leukocytes, UA LARGE (*)    WBC, UA >50 (*)    Bacteria, UA MANY (*)    All other components within normal limits  CK - Abnormal; Notable for the following components:   Total CK 569 (*)  All other components within normal limits  CBC - Abnormal; Notable for the following components:   WBC 11.0 (*)    Hemoglobin 11.9 (*)    All other components within normal limits  BASIC METABOLIC PANEL - Abnormal; Notable for the following components:   Glucose, Bld 103 (*)    Calcium 8.7 (*)    All other components within normal limits  ETHANOL  RAPID URINE DRUG SCREEN, HOSP PERFORMED  AMMONIA  TSH    EKG EKG Interpretation  Date/Time:  Thursday September 22 2018 21:25:05 EST Ventricular Rate:  85 PR Interval:    QRS Duration: 100 QT Interval:  402 QTC Calculation: 478 R Axis:   43 Text Interpretation:  Sinus rhythm Normal ECG When compared with ECG of 08/20/2008, No significant change was found Confirmed by Delora Fuel ([id number]) on 09/22/2018 11:55:26 PM Also confirmed by Delora Fuel ([id number]), editor Philomena Doheny [phone])  on 09/23/2018 7:26:09 AM   Radiology No results found.  Procedures Procedures (including critical care time)  Medications Ordered in ED Medications  Tdap (BOOSTRIX) injection 0.5 mL (0.5 mLs Intramuscular Given 09/23/18 0028)  sodium chloride 0.9 % bolus 500 mL (0 mLs Intravenous Stopped 09/23/18 0129)     Initial Impression / Assessment and Plan / ED Course  I have reviewed the triage vital signs and the nursing notes.  Pertinent labs & imaging results that were available during my care of the patient were reviewed by me and considered in my medical decision making (see chart for details).     Discussed with hospitalist who will see patient emergency department and  admit.  Final Clinical Impressions(s) / ED Diagnoses   Final diagnoses:  Acromioclavicular joint injury, right, initial encounter  Contusion of right temporofrontal scalp, initial encounter    ED Discharge Orders         Ordered    cefdinir (OMNICEF) 300 MG capsule  2 times daily,   Status:  Discontinued     09/25/18 1133    cefdinir (OMNICEF) 300 MG capsule  2 times daily     09/25/18 1145    amLODipine (NORVASC) 5 MG tablet  Daily at bedtime,   Status:  Discontinued     09/25/18 1133    Increase activity slowly     09/25/18 1133    Diet - low sodium heart healthy     09/25/18 1133    Call MD for:  temperature >100.4     09/25/18 1133    Call MD for:  difficulty breathing, headache or visual disturbances     09/25/18 1133    Call MD for:  persistant dizziness or light-headedness     09/25/18 1133    amLODipine (NORVASC) 10 MG tablet  Daily at bedtime     09/25/18 1145           Julianne Rice, MD 09/26/18 1743

## 2018-09-22 NOTE — ED Triage Notes (Signed)
Per EMS, pt from home. Pt's sister has been trying to get a hold of her, had neighbor go over and check on her. Pt was A&OX4 per neighbor but reports house was messy, strong smell of urine, abnormal for pt. LSN/talked was 12/22. Pt may have had a possible fall today(?) has a lac with dried blood to rt side of head and bruising to rt shoulder, pain 5/10. Also nausea.  Hx of HTN and Afib, has not been taking medications.  EMS BP 150/83, P 84, 96% RA, R 16. CBG 121

## 2018-09-23 DIAGNOSIS — R410 Disorientation, unspecified: Secondary | ICD-10-CM | POA: Diagnosis not present

## 2018-09-23 DIAGNOSIS — S4991XA Unspecified injury of right shoulder and upper arm, initial encounter: Secondary | ICD-10-CM | POA: Diagnosis not present

## 2018-09-23 DIAGNOSIS — N390 Urinary tract infection, site not specified: Secondary | ICD-10-CM | POA: Diagnosis not present

## 2018-09-23 LAB — URINALYSIS, ROUTINE W REFLEX MICROSCOPIC
Bilirubin Urine: NEGATIVE
GLUCOSE, UA: NEGATIVE mg/dL
Ketones, ur: 5 mg/dL — AB
Nitrite: NEGATIVE
PH: 5 (ref 5.0–8.0)
Protein, ur: 30 mg/dL — AB
Specific Gravity, Urine: 1.017 (ref 1.005–1.030)
WBC, UA: >50 WBC/hpf — ABNORMAL HIGH (ref 0–5)

## 2018-09-23 LAB — BASIC METABOLIC PANEL
Anion gap: 13 (ref 5–15)
BUN: 19 mg/dL (ref 8–23)
CO2: 22 mmol/L (ref 22–32)
Calcium: 8.7 mg/dL — ABNORMAL LOW (ref 8.9–10.3)
Chloride: 105 mmol/L (ref 98–111)
Creatinine, Ser: 0.85 mg/dL (ref 0.44–1.00)
GFR calc Af Amer: >60 mL/min (ref >60)
GFR calc non Af Amer: >60 mL/min (ref >60)
Glucose, Bld: 103 mg/dL — ABNORMAL HIGH (ref 70–99)
Potassium: 4.1 mmol/L (ref 3.5–5.1)
Sodium: 140 mmol/L (ref 135–145)

## 2018-09-23 LAB — CBC
HCT: 36.7 % (ref 36.0–46.0)
Hemoglobin: 11.9 g/dL — ABNORMAL LOW (ref 12.0–15.0)
MCH: 29 pg (ref 26.0–34.0)
MCHC: 32.4 g/dL (ref 30.0–36.0)
MCV: 89.3 fL (ref 80.0–100.0)
Platelets: 167 10*3/uL (ref 150–400)
RBC: 4.11 MIL/uL (ref 3.87–5.11)
RDW: 13.9 % (ref 11.5–15.5)
WBC: 11 10*3/uL — ABNORMAL HIGH (ref 4.0–10.5)
nRBC: 0 % (ref 0.0–0.2)

## 2018-09-23 LAB — RAPID URINE DRUG SCREEN, HOSP PERFORMED
AMPHETAMINES: NOT DETECTED
BARBITURATES: NOT DETECTED
BENZODIAZEPINES: NOT DETECTED
Cocaine: NOT DETECTED
Opiates: NOT DETECTED
Tetrahydrocannabinol: NOT DETECTED

## 2018-09-23 LAB — TSH: TSH: 1.014 u[IU]/mL (ref 0.350–4.500)

## 2018-09-23 LAB — AMMONIA: Ammonia: 21 umol/L (ref 9–35)

## 2018-09-23 MED ORDER — ACETAMINOPHEN 650 MG RE SUPP
650.0000 mg | Freq: Four times a day (QID) | RECTAL | Status: DC | PRN
Start: 2018-09-23 — End: 2018-09-25

## 2018-09-23 MED ORDER — ACETAMINOPHEN 325 MG PO TABS: 650.0000 mg | ORAL_TABLET | Freq: Four times a day (QID) | ORAL | Status: DC | PRN

## 2018-09-23 MED ORDER — ENOXAPARIN SODIUM 40 MG/0.4ML ~~LOC~~ SOLN
40.0000 mg | SUBCUTANEOUS | Status: DC
  Administered 2018-09-23 – 2018-09-24 (×2): 40 mg via SUBCUTANEOUS
  Filled 2018-09-23 (×2): qty 0.4

## 2018-09-23 MED ORDER — SODIUM CHLORIDE 0.9 % IV SOLN
1.0000 g | INTRAVENOUS | Status: DC
  Administered 2018-09-23 – 2018-09-25 (×3): 1 g via INTRAVENOUS
  Filled 2018-09-23 (×3): qty 10

## 2018-09-23 MED ORDER — ONDANSETRON HCL 4 MG PO TABS: 4.0000 mg | ORAL_TABLET | Freq: Four times a day (QID) | ORAL | Status: DC | PRN

## 2018-09-23 MED ORDER — SODIUM CHLORIDE 0.9 % IV SOLN
INTRAVENOUS | Status: DC | PRN
  Administered 2018-09-23: 250 mL via INTRAVENOUS

## 2018-09-23 MED ORDER — ONDANSETRON HCL 4 MG/2ML IJ SOLN: 4.0000 mg | Freq: Four times a day (QID) | INTRAMUSCULAR | Status: DC | PRN

## 2018-09-23 NOTE — ED Notes (Signed)
Admitting MD at bedside.

## 2018-09-23 NOTE — Evaluation (Signed)
Occupational Therapy Evaluation Patient Details Name: Alicia Richardson MRN: [id number] DOB: December 29, 1934 Today's Date: 09/23/2018    History of Present Illness Pt is an 83 y.o. female with medical history significant of SVT s/p ablation. Sister was unable to reach her by phone so a neighbor went over to check. Pt appeared confused. Fall was suspected but pt does not remember falling. ED workup revealed head laceration, R AC joint separation, and UTI.    Clinical Impression   PTA patient independent and driving.  Currently admitted for above and limited by problem list below, including adherence to R UE precautions, impaired balance and cognition.  Patient requires min assist for UB ADLs, mod assist for LB ADLs, min assist for grooming at sink standing, and min assist for transfers. Patient lives alone and would prefer to dc home with [facility] services, but due to limited support at this time recommend SNF rehab at discharge.  Will continue to follow.     Follow Up Recommendations  SNF;Supervision/Assistance - 24 hour    Equipment Recommendations  Other (comment)(TBD at next venue of care )    Recommendations for Other Services       Precautions / Restrictions Precautions Precautions: Fall;Other (comment) Precaution Comments: sling at all times to R UE  Restrictions Weight Bearing Restrictions: Yes RUE Weight Bearing: Non weight bearing      Mobility Bed Mobility Overal bed mobility: Needs Assistance Bed Mobility: Supine to Sit     Supine to sit: Min assist     General bed mobility comments: min assist to elevate trunk  Transfers Overall transfer level: Needs assistance Equipment used: None Transfers: Sit to/from Stand Sit to Stand: Min assist Stand pivot transfers: Min assist       General transfer comment: assist to power up and stabilize balance    Balance Overall balance assessment: Needs assistance Sitting-balance support: No upper extremity supported;Feet  supported Sitting balance-Leahy Scale: Good     Standing balance support: Single extremity supported;During functional activity Standing balance-Leahy Scale: Poor Standing balance comment: reliant on external support                           ADL either performed or assessed with clinical judgement   ADL Overall ADL's : Needs assistance/impaired     Grooming: Wash/dry hands;Oral care;Brushing hair;Minimal assistance;Sitting   Upper Body Bathing: Minimal assistance;Sitting   Lower Body Bathing: Minimal assistance;Sit to/from stand   Upper Body Dressing : Minimal assistance;Sitting   Lower Body Dressing: Moderate assistance;Sit to/from stand   Toilet Transfer: Minimal assistance;Ambulation Toilet Transfer Details (indicate cue type and reason): simulated in room         Functional mobility during ADLs: Minimal assistance;Cueing for sequencing;Cueing for safety       Vision Baseline Vision/History: Wears glasses Wears Glasses: Reading only Patient Visual Report: No change from baseline Additional Comments: further assessment required     Perception     Praxis      Pertinent Vitals/Pain Pain Assessment: No/denies pain     Hand Dominance Right   Extremity/Trunk Assessment Upper Extremity Assessment Upper Extremity Assessment: RUE deficits/detail RUE Deficits / Details: immobilized in sling, appears WFL at wrist and hand    Lower Extremity Assessment Lower Extremity Assessment: Defer to PT evaluation   Cervical / Trunk Assessment Cervical / Trunk Assessment: Normal   Communication Communication Communication: No difficulties   Cognition Arousal/Alertness: Awake/alert Behavior During Therapy: WFL for tasks assessed/performed;Flat affect Overall Cognitive  Status: Impaired/Different from baseline Area of Impairment: Orientation;Memory;Safety/judgement;Problem solving                 Orientation Level: Disoriented to;Time((year))    Memory: Decreased short-term memory   Safety/Judgement: Decreased awareness of safety   Problem Solving: Difficulty sequencing;Slow processing;Requires verbal cues General Comments: patient requires maximal cueing throughout session to avoid shoulder ROM and use of R UE, disoriented to time   General Comments  neighbor present during session    Exercises     Shoulder Instructions      Home Living Family/patient expects to be discharged to:: Private residence Living Arrangements: Alone Available Help at Discharge: Neighbor;Available PRN/intermittently(Family attempting to arrange 24-hour assist.) Type of Home: Apartment Home Access: Stairs to enter Entrance Stairs-Number of Steps: 2 flights (second floor apt) Entrance Stairs-Rails: Right Home Layout: One level     Bathroom Shower/Tub: Teacher, early years/pre: Standard Bathroom Accessibility: No   Home Equipment: None          Prior Functioning/Environment Level of Independence: Independent        Comments: Drives. Lives alone. No family in town.         OT Problem List: Decreased strength;Decreased activity tolerance;Impaired balance (sitting and/or standing);Decreased cognition;Decreased safety awareness;Decreased knowledge of use of DME or AE;Decreased knowledge of precautions;Impaired UE functional use      OT Treatment/Interventions: Self-care/ADL training;Therapeutic exercise;Energy conservation;DME and/or AE instruction;Therapeutic activities;Patient/family education;Balance training;Cognitive remediation/compensation    OT Goals(Current goals can be found in the care plan section) Acute Rehab OT Goals Patient Stated Goal: home OT Goal Formulation: With patient Time For Goal Achievement: 10/07/18 Potential to Achieve Goals: Good  OT Frequency: Min 2X/week   Barriers to D/C:            Co-evaluation              AM-PAC OT "6 Clicks" Daily Activity     Outcome Measure Help from  another person eating meals?: None Help from another person taking care of personal grooming?: A Little Help from another person toileting, which includes using toliet, bedpan, or urinal?: A Little Help from another person bathing (including washing, rinsing, drying)?: A Lot Help from another person to put on and taking off regular upper body clothing?: A Lot Help from another person to put on and taking off regular lower body clothing?: A Lot 6 Click Score: 16   End of Session Equipment Utilized During Treatment: Gait belt;Other (comment)(sling) Nurse Communication: Mobility status  Activity Tolerance: Patient tolerated treatment well Patient left: Other (comment)(EOB with PT )  OT Visit Diagnosis: Unsteadiness on feet (R26.81);Other symptoms and signs involving cognitive function;Muscle weakness (generalized) (M62.81);History of falling (Z91.81)                Time: 1029-1100 OT Time Calculation (min): 31 min Charges:  OT General Charges $OT Visit: 1 Visit OT Evaluation $OT Eval Moderate Complexity: 1 Mod OT Treatments $Self Care/Home Management : 8-22 mins  Delight Stare, OT Acute Rehabilitation Services Pager [phone] Office [phone]   Delight Stare 09/23/2018, 12:32 PM

## 2018-09-23 NOTE — Evaluation (Addendum)
Physical Therapy Evaluation Patient Details Name: Alicia Richardson MRN: [id number] DOB: [birth year] Today's Date: 09/23/2018   History of Present Illness  Pt is an 83 y.o. female with medical history significant of SVT s/p ablation. Sister was unable to reach her by phone so a neighbor went over to check. Pt appeared confused. Fall was suspected but pt does not remember falling. ED workup revealed head laceration, R AC joint separation, and UTI.     Clinical Impression  Pt admitted with above diagnosis. Pt currently with functional limitations due to the deficits listed below (see PT Problem List). PTA pt lived alone, independent and active. On eval, she required min assist transfers and HH/min assist ambulation 150 feet demonstrating unsteady gait. She presents with deficits in cognition and balance putting her at high risk for falls.  Pt will benefit from skilled PT to increase their independence and safety with mobility to allow discharge to the venue listed below.  If family able to arrange 24-hour assist, pt can d/c home with HHPT. Without 24-hour assist, recommend SNF at d/c.      Follow Up Recommendations SNF;Supervision/Assistance - 24 hour    Equipment Recommendations  (TBA)    Recommendations for Other Services       Precautions / Restrictions Precautions Precautions: Fall;Other (comment) Precaution Comments: sling at all times to R UE  Required Braces or Orthoses: Sling Restrictions Weight Bearing Restrictions: Yes RUE Weight Bearing: Non weight bearing      Mobility  Bed Mobility Overal bed mobility: Needs Assistance Bed Mobility: Supine to Sit     Supine to sit: Min assist     General bed mobility comments: min assist to elevate trunk  Transfers Overall transfer level: Needs assistance Equipment used: None Transfers: Sit to/from Stand Sit to Stand: Min assist Stand pivot transfers: Min assist       General transfer comment: assist to power up and  stabilize balance  Ambulation/Gait Ambulation/Gait assistance: Min assist Gait Distance (Feet): 150 Feet Assistive device: 1 person hand held assist Gait Pattern/deviations: Step-through pattern;Decreased stride length Gait velocity: decreased Gait velocity interpretation: 1.31 - 2.62 ft/sec, indicative of limited community ambulator General Gait Details: unsteady gait, LOB x 2 with min assist to recover. HHA provided on L. Pt reports feeling dizzy.   Stairs            Wheelchair Mobility    Modified Rankin (Stroke Patients Only)       Balance Overall balance assessment: Needs assistance Sitting-balance support: No upper extremity supported;Feet supported Sitting balance-Leahy Scale: Good     Standing balance support: Single extremity supported;During functional activity Standing balance-Leahy Scale: Poor Standing balance comment: reliant on external support                             Pertinent Vitals/Pain Pain Assessment: No/denies pain    Home Living Family/patient expects to be discharged to:: Private residence Living Arrangements: Alone Available Help at Discharge: Neighbor;Available PRN/intermittently(Family attempting to arrange 24-hour assist.) Type of Home: Apartment Home Access: Stairs to enter Entrance Stairs-Rails: Right Entrance Stairs-Number of Steps: 2 flights (second floor apt) Home Layout: One level Home Equipment: None      Prior Function Level of Independence: Independent         Comments: Drives. Lives alone. No family in town.      Hand Dominance   Dominant Hand: Right    Extremity/Trunk Assessment   Upper Extremity Assessment Upper  Extremity Assessment: Defer to OT evaluation RUE Deficits / Details: immobilized in sling, appears WFL at wrist and hand     Lower Extremity Assessment Lower Extremity Assessment: Overall WFL for tasks assessed    Cervical / Trunk Assessment Cervical / Trunk Assessment: Normal   Communication   Communication: No difficulties  Cognition Arousal/Alertness: Awake/alert Behavior During Therapy: WFL for tasks assessed/performed;Flat affect Overall Cognitive Status: Impaired/Different from baseline Area of Impairment: Orientation;Memory;Safety/judgement;Problem solving                 Orientation Level: Disoriented to;Time((year))   Memory: Decreased short-term memory   Safety/Judgement: Decreased awareness of safety   Problem Solving: Difficulty sequencing;Slow processing;Requires verbal cues General Comments: patient requires maximal cueing throughout session to avoid shoulder ROM and use of R UE, disoriented to time      General Comments General comments (skin integrity, edema, etc.): neighbor present during session    Exercises     Assessment/Plan    PT Assessment Patient needs continued PT services  PT Problem List Decreased balance;Decreased cognition;Decreased knowledge of precautions;Decreased mobility;Decreased safety awareness;Decreased activity tolerance       PT Treatment Interventions Functional mobility training;Balance training;Patient/family education;Gait training;Therapeutic activities;Cognitive remediation;Therapeutic exercise;Stair training    PT Goals (Current goals can be found in the Care Plan section)  Acute Rehab PT Goals Patient Stated Goal: home PT Goal Formulation: With patient Time For Goal Achievement: 10/07/18 Potential to Achieve Goals: Good    Frequency Min 3X/week   Barriers to discharge Decreased caregiver support      Co-evaluation               AM-PAC PT "6 Clicks" Mobility  Outcome Measure Help needed turning from your back to your side while in a flat bed without using bedrails?: A Little Help needed moving from lying on your back to sitting on the side of a flat bed without using bedrails?: A Little Help needed moving to and from a bed to a chair (including a wheelchair)?: A Little Help  needed standing up from a chair using your arms (e.g., wheelchair or bedside chair)?: A Little Help needed to walk in hospital room?: A Little Help needed climbing 3-5 steps with a railing? : A Lot 6 Click Score: 17    End of Session Equipment Utilized During Treatment: Gait belt;Other (comment)(RUE sling) Activity Tolerance: Patient tolerated treatment well Patient left: in chair;with chair alarm set;with call bell/phone within reach Nurse Communication: Mobility status PT Visit Diagnosis: Unsteadiness on feet (R26.81);Difficulty in walking, not elsewhere classified (R26.2)    Time: 5456-2563 PT Time Calculation (min) (ACUTE ONLY): 13 min   Charges:   PT Evaluation $PT Eval Low Complexity: 1 Low          Lorrin Goodell, PT  Office # [phone] Pager [phone]   Lorriane Shire 09/23/2018, 2:40 PM

## 2018-09-23 NOTE — H&P (Signed)
History and Physical    Alicia NAND Richardson:[id number] DOB: 05-06-1935 DOA: 09/22/2018  PCP: System, Pcp Not In  Patient coming from: Home  I have personally briefly reviewed patient's old medical records in [place]  Chief Complaint: Fall  HPI: Alicia Richardson is a 83 y.o. female with medical history significant of SVT s/p ablation.  Patient only on vitamins as chronic med.  Patient thinks she may have fallen today but doesn't remember when she fell and cant really tell me much about past few days.  Her sister has been trying to contact her for several days.  Neighbor went over and found the patient's house was disorganized and a strong smell of urine.  Patient complaining of pain to the right side of her head and her right shoulder.   ED Course: Head lac and R AC joint separation found on work up.  Also UA demonstrates UTI.   Review of Systems: As per HPI otherwise 10 point review of systems negative.   Past Medical History:  Diagnosis Date  . Dysrhythmia 2009   SVT-ablation by Dr Lovena Le 09-24-08  . Mass on back     Past Surgical History:  Procedure Laterality Date  . ABLATION OF DYSRHYTHMIC FOCUS  09/24/2008   by Dr Lovena Le for SVT  . APPENDECTOMY    . MASS EXCISION Right 02/11/2018   Procedure: EXCISION RIGHT UPPER BACK  MASS;  Surgeon: Coralie Keens, MD;  Location: [facility];  Service: General;  Laterality: Right;  . TONSILLECTOMY       reports that she has never smoked. She has never used smokeless tobacco. She reports current alcohol use. She reports that she does not use drugs.  Allergies  Allergen Reactions  . Penicillins     Pt states she is not allergic to penicillin    Family History  Problem Relation Age of Onset  . Kidney disease Mother   . Heart attack Brother      Prior to Admission medications   Medication Sig Start Date End Date Taking? Authorizing Provider  Multiple Vitamin (MULTIVITAMIN WITH MINERALS) TABS tablet  Take 1 tablet by mouth daily.    [provider]  traMADol (ULTRAM) 50 MG tablet Take 1 tablet (50 mg total) by mouth every 6 (six) hours as needed. 02/11/18   Coralie Keens, MD    Physical Exam: Vitals:   09/22/18 2245 09/23/18 0000 09/23/18 0030 09/23/18 0100  BP:  (!) 172/85 (!) 163/85 (!) 155/77  Pulse: 83 83 76 77  Resp: 14 16 15 18   Temp:      TempSrc:      SpO2: 95% 95% 96% 97%  Weight:      Height:        Constitutional: NAD, calm, comfortable Eyes: PERRL, lids and conjunctivae normal ENMT: Mucous membranes are moist. Posterior pharynx clear of any exudate or lesions.Normal dentition.  Neck: normal, supple, no masses, no thyromegaly Respiratory: clear to auscultation bilaterally, no wheezing, no crackles. Normal respiratory effort. No accessory muscle use.  Cardiovascular: Regular rate and rhythm, no murmurs / rubs / gallops. No extremity edema. 2+ pedal pulses. No carotid bruits.  Abdomen: no tenderness, no masses palpated. No hepatosplenomegaly. Bowel sounds positive.  Musculoskeletal: R shoulder pain with ROM. Skin: Patient has contusion with dried blood to the right temporal region of her scalp. Neurologic: CN 2-12 grossly intact. Sensation intact, DTR normal. Strength 5/5 in all 4.  Psychiatric: Some confusion / difficulty with memory or giving  history surrounding events leading to her ED stay today.   Labs on Admission: I have personally reviewed following labs and imaging studies  CBC: Recent Labs  Lab 09/22/18 2209  WBC 11.8*  NEUTROABS 8.5*  HGB 12.7  HCT 40.7  MCV 90.0  PLT 712   Basic Metabolic Panel: Recent Labs  Lab 09/22/18 2209  NA 140  K 3.7  CL 103  CO2 28  GLUCOSE 122*  BUN 20  CREATININE 0.89  CALCIUM 8.9   GFR: Estimated Creatinine Clearance: 43.4 mL/min (by C-G formula based on SCr of 0.89 mg/dL). Liver Function Tests: Recent Labs  Lab 09/22/18 2209  AST 36  ALT 23  ALKPHOS 58  BILITOT 2.0*  PROT 6.9  ALBUMIN  3.5   No results for input(s): LIPASE, AMYLASE in the last 168 hours. No results for input(s): AMMONIA in the last 168 hours. Coagulation Profile: No results for input(s): INR, PROTIME in the last 168 hours. Cardiac Enzymes: Recent Labs  Lab 09/22/18 2209  CKTOTAL 569*   BNP (last 3 results) No results for input(s): PROBNP in the last 8760 hours. HbA1C: No results for input(s): HGBA1C in the last 72 hours. CBG: No results for input(s): GLUCAP in the last 168 hours. Lipid Profile: No results for input(s): CHOL, HDL, LDLCALC, TRIG, CHOLHDL, LDLDIRECT in the last 72 hours. Thyroid Function Tests: No results for input(s): TSH, T4TOTAL, FREET4, T3FREE, THYROIDAB in the last 72 hours. Anemia Panel: No results for input(s): VITAMINB12, FOLATE, FERRITIN, TIBC, IRON, RETICCTPCT in the last 72 hours. Urine analysis:    Component Value Date/Time   COLORURINE AMBER (A) 09/22/2018 2352   APPEARANCEUR CLOUDY (A) 09/22/2018 2352   LABSPEC 1.017 09/22/2018 2352   PHURINE 5.0 09/22/2018 2352   GLUCOSEU NEGATIVE 09/22/2018 2352   HGBUR SMALL (A) 09/22/2018 2352   BILIRUBINUR NEGATIVE 09/22/2018 2352   KETONESUR 5 (A) 09/22/2018 2352   PROTEINUR 30 (A) 09/22/2018 2352   UROBILINOGEN 0.2 08/15/2008 2143   NITRITE NEGATIVE 09/22/2018 2352   LEUKOCYTESUR LARGE (A) 09/22/2018 2352    Radiological Exams on Admission: Dg Shoulder Right  Result Date: 09/22/2018 CLINICAL DATA:  Recent fall with right shoulder pain, initial encounter EXAM: RIGHT SHOULDER - 2+ VIEW COMPARISON:  None. FINDINGS: Mild widening of the acromioclavicular joint is seen with a small bony density adjacent to the superior aspect of the acromion likely representing a small avulsion fracture. The humeral head is well seated. The underlying bony thorax is unremarkable. IMPRESSION: Widening of the acromioclavicular joint likely related to the recent injury with what appears to be an avulsion from the acromion superiorly.  Electronically Signed   By: Inez Catalina M.D.   On: 09/22/2018 23:10   Ct Head Wo Contrast  Result Date: 09/22/2018 CLINICAL DATA:  Recent fall with right-sided scalp laceration EXAM: CT HEAD WITHOUT CONTRAST CT CERVICAL SPINE WITHOUT CONTRAST TECHNIQUE: Multidetector CT imaging of the head and cervical spine was performed following the standard protocol without intravenous contrast. Multiplanar CT image reconstructions of the cervical spine were also generated. COMPARISON:  08/15/2008 FINDINGS: CT HEAD FINDINGS Brain: Diffuse atrophic changes are identified. Bilateral basal ganglia lacunar infarcts are again identified and stable. No findings to suggest acute hemorrhage, acute infarction or space-occupying mass lesion are noted. Vascular: No hyperdense vessel or unexpected calcification. Skull: Normal. Negative for fracture or focal lesion. Sinuses/Orbits: No acute finding. Other: None. CT CERVICAL SPINE FINDINGS Alignment: Within normal limits. Skull base and vertebrae: 7 cervical segments are well visualized. Multilevel osteophytic  changes and facet hypertrophic changes are seen. No acute fracture or acute facet abnormality is noted. Soft tissues and spinal canal: Surrounding soft tissues are within normal limits. Upper chest: Within normal limits. Other: None IMPRESSION: CT of the head: Chronic atrophic and ischemic changes without acute abnormality. CT of the cervical spine: Degenerative change without acute abnormality. Electronically Signed   By: Inez Catalina M.D.   On: 09/22/2018 23:20   Ct Cervical Spine Wo Contrast  Result Date: 09/22/2018 CLINICAL DATA:  Recent fall with right-sided scalp laceration EXAM: CT HEAD WITHOUT CONTRAST CT CERVICAL SPINE WITHOUT CONTRAST TECHNIQUE: Multidetector CT imaging of the head and cervical spine was performed following the standard protocol without intravenous contrast. Multiplanar CT image reconstructions of the cervical spine were also generated. COMPARISON:   08/15/2008 FINDINGS: CT HEAD FINDINGS Brain: Diffuse atrophic changes are identified. Bilateral basal ganglia lacunar infarcts are again identified and stable. No findings to suggest acute hemorrhage, acute infarction or space-occupying mass lesion are noted. Vascular: No hyperdense vessel or unexpected calcification. Skull: Normal. Negative for fracture or focal lesion. Sinuses/Orbits: No acute finding. Other: None. CT CERVICAL SPINE FINDINGS Alignment: Within normal limits. Skull base and vertebrae: 7 cervical segments are well visualized. Multilevel osteophytic changes and facet hypertrophic changes are seen. No acute fracture or acute facet abnormality is noted. Soft tissues and spinal canal: Surrounding soft tissues are within normal limits. Upper chest: Within normal limits. Other: None IMPRESSION: CT of the head: Chronic atrophic and ischemic changes without acute abnormality. CT of the cervical spine: Degenerative change without acute abnormality. Electronically Signed   By: Inez Catalina M.D.   On: 09/22/2018 23:20    EKG: Independently reviewed.  Assessment/Plan Principal Problem:   Acute lower UTI Active Problems:   Delirium   Injury of right acromioclavicular joint    1. Acute lower UTI as cause of apparent delirium - 1. Suspect mental status is not at baseline given she has no known h/o dementia and lives alone 2. Work up today in ED does demonstrate UTI with large LE and >50 WBC / hpf on UA 3. UCx 4. Rocephin 2. Injury of R acromioclavicular joint - 1. Arm in sling 2. PT/OT  DVT prophylaxis: Lovenox Code Status: Full Family Communication: No family in room Disposition Plan: Home after admit Consults called: None Admission status: Place in [place] - likely convert to [place], [facility] Pager [phone] Only works nights!  If 7AM-7PM, please contact the primary day team physician taking care of patient  [email or web address] Password  [email or web address]  09/23/2018, 1:07 AM

## 2018-09-23 NOTE — Progress Notes (Signed)
Patient seen and examined this morning, admitted overnight by Dr. Alcario Drought, H&P reviewed and agree with the assessment and plan.  83 year old female with history of SVT status post ablation, only on vitamins does not take any other medication who was admitted to the hospital on 12/27 with a fall, confusion, found to have a significant UTI.  Acute lower UTI -Continue ceftriaxone, urine cultures are pending  Acute metabolic encephalopathy/delirium -Appears to be better from prior notes, still not oriented to year.  No longer delirious.  She lives independently and there is no history of dementia  Injury of right acromioclavicular joint -Discussed with orthopedic surgery over the phone, conservative management, sling, nonweightbearing right upper extremity and recommended outpatient follow-up with Dr. Cheri Fowler. Cruzita Lederer, MD, PhD [facility] Pager [phone]  If 7PM-7AM, please contact night-coverage [email or web address] Password [email or web address]

## 2018-09-23 NOTE — Clinical Social Work Note (Addendum)
**09/23/28 - CSW signing off as patient declining SNF. Please reconsult if any other SW intervention services needed prior to discharge.   Clinical Social Work Assessment  Patient Details  Name: Alicia Richardson MRN: [id number] Date of Birth: 12-16-1934  Date of referral:  09/23/18               Reason for consult:  Discharge Planning                Permission sought to share information with:  Other(CSW did not ask to contact family or friends ) Permission granted to share information::  No  Name::        Agency::     Relationship::     Contact Information:     Housing/Transportation Living arrangements for the past 2 months:  Apartment Source of Information:  Patient Patient Interpreter Needed:  None Criminal Activity/Legal Involvement Pertinent to Current Situation/Hospitalization:  No - Comment as needed Significant Relationships:  Neighbor, Other Family Members, Friend Lives with:  Self Do you feel safe going back to the place where you live?  Yes Need for family participation in patient care:  No (Coment)(Per patient, none of her family live in [place])  Care giving concerns: Patient expressed no concerns regarding her safety at home upon discharge.   Social Worker assessment / plan:  Reviewed PT note and SNF was recommended. CSW visited with patient to discuss her discharge plan and SNF recommendation. Patient was lying in bed and was awake, alert and agreeable to talking with CSW regarding her discharge plan. Alicia Richardson immediately shook her head no regarding SNF placement. Conversation ensued regarding persons who could stay with her for a few days at discharge, look in on her, or call to check on her at discharge.  Patient reported that she has a Industrial/product designer and friend Alicia Richardson who has a business and is busy and she has a boyfriend who travels for his job, however she does have phone contact with these persons. Patient added that she has friends and neighbors who she communicates with  regularly by phone and that she goes out with occasionally for lunch/dinner. Alicia Richardson responded no to having someone who could stay with her far a few days.   Patient reported that she has a sister who lives in [place] and she has talked with her sister about moving to [place], [place] to live with her. Alicia Richardson added that she really does not like [place] because of the mosquitos and heat, but is considering moving once the lease is up on her apartment. Home health services were discussed and patient is agreeable to this. She was advised that a nurse case manager will talk with her about [facility] services as she needs readiness for discharge.  Employment status:  Retired Research officer, political party) PT Recommendations:  [place] / Referral to community resources:  [facility](None requested or needed as patient declined SNF)  Patient/Family's Response to care: Patient expressed no concerns regarding her care during hospitalization.  Patient/Family's Understanding of and Emotional Response to Diagnosis, Current Treatment, and Prognosis:  Patient did not disagree that rehab services could benefit her, but declined SNF for ST rehab.  Emotional Assessment Appearance:  Appears stated age Attitude/Demeanor/Rapport:  Engaged Affect (typically observed):  Pleasant Orientation:  Oriented to Self, Oriented to Place, Oriented to Situation Alcohol / Substance use:  Tobacco Use, Alcohol Use, Illicit Drugs(Per H&P patient has never smoked, drinks socially and does not use illicit  drugs) Psych involvement (Current and /or in the community):  No (Comment)  Discharge Needs  Concerns to be addressed:  Discharge Planning Concerns, Home Safety Concerns Readmission within the last 30 days:  No Current discharge risk:  None Barriers to Discharge:  Continued Medical Work up   [employer] Alicia Homer, LCSW 09/23/2018, 2:27 PM

## 2018-09-24 DIAGNOSIS — I10 Essential (primary) hypertension: Secondary | ICD-10-CM | POA: Diagnosis not present

## 2018-09-24 DIAGNOSIS — R410 Disorientation, unspecified: Secondary | ICD-10-CM | POA: Diagnosis not present

## 2018-09-24 DIAGNOSIS — N390 Urinary tract infection, site not specified: Secondary | ICD-10-CM | POA: Diagnosis not present

## 2018-09-24 DIAGNOSIS — S4991XA Unspecified injury of right shoulder and upper arm, initial encounter: Secondary | ICD-10-CM | POA: Diagnosis not present

## 2018-09-24 MED ORDER — AMLODIPINE BESYLATE 5 MG PO TABS
5.0000 mg | ORAL_TABLET | Freq: Every day | ORAL | Status: DC
  Administered 2018-09-24: 5 mg via ORAL
  Filled 2018-09-24: qty 1

## 2018-09-24 NOTE — Progress Notes (Signed)
PROGRESS NOTE    Alicia Richardson  DQQ:[id number] DOB: 07/24/1935 DOA: 09/22/2018 PCP: System, Pcp Not In   Brief Narrative: Alicia Richardson is a 83 y.o. female with medical history significant of SVT s/p ablation and hypertension. She presented secondary to fall and found to have a UTI.   Assessment & Plan:   Principal Problem:   Acute lower UTI Active Problems:   Delirium   Injury of right acromioclavicular joint   Fall Sounds like patient had a syncopal episode. -Orthostatic vital signs -PT recommendations: SNF, however, patient is declining -Telemetry  UTI E. Coli on urine culture. Sensitivities pending. -Continue Ceftriaxone  Delirium Likely secondary to UTI. Appears resolved.  Injury of right acromioclavicular joint Non-weight bearing -Continue sling, outpatient follow-up with Dr. Tamera Punt, orthopedic surgery  Hypertension Previously diagnosed.  -Amlodipine 5 mg qhs   DVT prophylaxis: Lovenox Code Status:   Code Status: Full Code Family Communication: None at bedside Disposition Plan: Discharge in 24 hours pending culture sensitivities   Consultants:   Orthopedic surgery  Procedures:   None  Antimicrobials:  Ceftriaxone    Subjective: No issues today. Hoping to go home  Objective: Vitals:   09/23/18 2018 09/23/18 2211 09/24/18 0412 09/24/18 0852  BP:  (!) 167/79 (!) 152/75 (!) 184/77  Pulse:  80 73 80  Resp:   16 18  Temp:  98.4 F (36.9 C) 99 F (37.2 C) 98.4 F (36.9 C)  TempSrc:   Oral Oral  SpO2:  100% 95% 93%  Weight: 68 kg     Height:        Intake/Output Summary (Last 24 hours) at 09/24/2018 1418 Last data filed at 09/24/2018 1220 Gross per 24 hour  Intake 940 ml  Output 0 ml  Net 940 ml   Filed Weights   09/22/18 2126 09/23/18 0200 09/23/18 2018  Weight: 63.5 kg 68.1 kg 68 kg    Examination:  General exam: Appears calm and comfortable Respiratory system: Clear to auscultation. Respiratory effort  normal. Cardiovascular system: S1 & S2 heard, RRR. No murmurs, rubs, gallops or clicks. Gastrointestinal system: Abdomen is nondistended, soft and nontender. No organomegaly or masses felt. Normal bowel sounds heard. Central nervous system: Alert and oriented. No focal neurological deficits. Musculoskeletal: No edema. No calf tenderness. Tenderness over right AC joint Skin: No cyanosis. No rashes Psychiatry: Judgement and insight appear normal. Mood & affect appropriate.     Data Reviewed: I have personally reviewed following labs and imaging studies  CBC: Recent Labs  Lab 09/22/18 2209 09/23/18 0419  WBC 11.8* 11.0*  NEUTROABS 8.5*  --   HGB 12.7 11.9*  HCT 40.7 36.7  MCV 90.0 89.3  PLT 166 194   Basic Metabolic Panel: Recent Labs  Lab 09/22/18 2209 09/23/18 0419  NA 140 140  K 3.7 4.1  CL 103 105  CO2 28 22  GLUCOSE 122* 103*  BUN 20 19  CREATININE 0.89 0.85  CALCIUM 8.9 8.7*   GFR: Estimated Creatinine Clearance: 47 mL/min (by C-G formula based on SCr of 0.85 mg/dL). Liver Function Tests: Recent Labs  Lab 09/22/18 2209  AST 36  ALT 23  ALKPHOS 58  BILITOT 2.0*  PROT 6.9  ALBUMIN 3.5   No results for input(s): LIPASE, AMYLASE in the last 168 hours. Recent Labs  Lab 09/23/18 0016  AMMONIA 21   Coagulation Profile: No results for input(s): INR, PROTIME in the last 168 hours. Cardiac Enzymes: Recent Labs  Lab 09/22/18 2209  CKTOTAL 569*  BNP (last 3 results) No results for input(s): PROBNP in the last 8760 hours. HbA1C: No results for input(s): HGBA1C in the last 72 hours. CBG: No results for input(s): GLUCAP in the last 168 hours. Lipid Profile: No results for input(s): CHOL, HDL, LDLCALC, TRIG, CHOLHDL, LDLDIRECT in the last 72 hours. Thyroid Function Tests: Recent Labs    09/23/18 0008  TSH 1.014   Anemia Panel: No results for input(s): VITAMINB12, FOLATE, FERRITIN, TIBC, IRON, RETICCTPCT in the last 72 hours. Sepsis Labs: No  results for input(s): PROCALCITON, LATICACIDVEN in the last 168 hours.  Recent Results (from the past 240 hour(s))  Culture, Urine     Status: Abnormal (Preliminary result)   Collection Time: 09/23/18  1:01 AM  Result Value Ref Range Status   Specimen Description URINE, RANDOM  Final   Special Requests   Final    NONE Performed at [facility], [address], [place], [place] [address]    Culture >=100,000 COLONIES/mL ESCHERICHIA COLI (A)  Final   Report Status PENDING  Incomplete         Radiology Studies: Dg Shoulder Right  Result Date: 09/22/2018 CLINICAL DATA:  Recent fall with right shoulder pain, initial encounter EXAM: RIGHT SHOULDER - 2+ VIEW COMPARISON:  None. FINDINGS: Mild widening of the acromioclavicular joint is seen with a small bony density adjacent to the superior aspect of the acromion likely representing a small avulsion fracture. The humeral head is well seated. The underlying bony thorax is unremarkable. IMPRESSION: Widening of the acromioclavicular joint likely related to the recent injury with what appears to be an avulsion from the acromion superiorly. Electronically Signed   By: Inez Catalina M.D.   On: 09/22/2018 23:10   Ct Head Wo Contrast  Result Date: 09/22/2018 CLINICAL DATA:  Recent fall with right-sided scalp laceration EXAM: CT HEAD WITHOUT CONTRAST CT CERVICAL SPINE WITHOUT CONTRAST TECHNIQUE: Multidetector CT imaging of the head and cervical spine was performed following the standard protocol without intravenous contrast. Multiplanar CT image reconstructions of the cervical spine were also generated. COMPARISON:  08/15/2008 FINDINGS: CT HEAD FINDINGS Brain: Diffuse atrophic changes are identified. Bilateral basal ganglia lacunar infarcts are again identified and stable. No findings to suggest acute hemorrhage, acute infarction or space-occupying mass lesion are noted. Vascular: No hyperdense vessel or unexpected calcification. Skull: Normal.  Negative for fracture or focal lesion. Sinuses/Orbits: No acute finding. Other: None. CT CERVICAL SPINE FINDINGS Alignment: Within normal limits. Skull base and vertebrae: 7 cervical segments are well visualized. Multilevel osteophytic changes and facet hypertrophic changes are seen. No acute fracture or acute facet abnormality is noted. Soft tissues and spinal canal: Surrounding soft tissues are within normal limits. Upper chest: Within normal limits. Other: None IMPRESSION: CT of the head: Chronic atrophic and ischemic changes without acute abnormality. CT of the cervical spine: Degenerative change without acute abnormality. Electronically Signed   By: Inez Catalina M.D.   On: 09/22/2018 23:20   Ct Cervical Spine Wo Contrast  Result Date: 09/22/2018 CLINICAL DATA:  Recent fall with right-sided scalp laceration EXAM: CT HEAD WITHOUT CONTRAST CT CERVICAL SPINE WITHOUT CONTRAST TECHNIQUE: Multidetector CT imaging of the head and cervical spine was performed following the standard protocol without intravenous contrast. Multiplanar CT image reconstructions of the cervical spine were also generated. COMPARISON:  08/15/2008 FINDINGS: CT HEAD FINDINGS Brain: Diffuse atrophic changes are identified. Bilateral basal ganglia lacunar infarcts are again identified and stable. No findings to suggest acute hemorrhage, acute infarction or space-occupying mass  lesion are noted. Vascular: No hyperdense vessel or unexpected calcification. Skull: Normal. Negative for fracture or focal lesion. Sinuses/Orbits: No acute finding. Other: None. CT CERVICAL SPINE FINDINGS Alignment: Within normal limits. Skull base and vertebrae: 7 cervical segments are well visualized. Multilevel osteophytic changes and facet hypertrophic changes are seen. No acute fracture or acute facet abnormality is noted. Soft tissues and spinal canal: Surrounding soft tissues are within normal limits. Upper chest: Within normal limits. Other: None IMPRESSION: CT  of the head: Chronic atrophic and ischemic changes without acute abnormality. CT of the cervical spine: Degenerative change without acute abnormality. Electronically Signed   By: Inez Catalina M.D.   On: 09/22/2018 23:20        Scheduled Meds: . enoxaparin (LOVENOX) injection  40 mg Subcutaneous Q24H   Continuous Infusions: . sodium chloride Stopped (09/23/18 0500)  . cefTRIAXone (ROCEPHIN)  IV 1 g (09/24/18 0054)     LOS: 0 days     Cordelia Poche, MD [facility] 09/24/2018, 2:18 PM  If 7PM-7AM, please contact night-coverage [email or web address]

## 2018-09-25 DIAGNOSIS — N390 Urinary tract infection, site not specified: Secondary | ICD-10-CM | POA: Diagnosis not present

## 2018-09-25 DIAGNOSIS — R410 Disorientation, unspecified: Secondary | ICD-10-CM | POA: Diagnosis not present

## 2018-09-25 DIAGNOSIS — S4991XA Unspecified injury of right shoulder and upper arm, initial encounter: Secondary | ICD-10-CM | POA: Diagnosis not present

## 2018-09-25 DIAGNOSIS — I10 Essential (primary) hypertension: Secondary | ICD-10-CM | POA: Diagnosis not present

## 2018-09-25 LAB — URINE CULTURE

## 2018-09-25 MED ORDER — CEFDINIR 300 MG PO CAPS: 300.0000 mg | ORAL_CAPSULE | Freq: Two times a day (BID) | ORAL | 0 refills | Status: AC

## 2018-09-25 MED ORDER — AMLODIPINE BESYLATE 10 MG PO TABS: 10.0000 mg | ORAL_TABLET | Freq: Every day | ORAL | 0 refills | Status: DC

## 2018-09-25 MED ORDER — CEFDINIR 300 MG PO CAPS: 300.0000 mg | ORAL_CAPSULE | Freq: Two times a day (BID) | ORAL | 0 refills | Status: DC

## 2018-09-25 MED ORDER — AMLODIPINE BESYLATE 5 MG PO TABS: 10.0000 mg | ORAL_TABLET | Freq: Every day | ORAL | 0 refills | Status: DC

## 2018-09-25 NOTE — Care Management (Signed)
Spoke w patient, she declined [facility] services.

## 2018-09-25 NOTE — Discharge Instructions (Addendum)
Alicia Richardson,  You are admitted because of a fall.  You are found to have a right shoulder dislocation in addition to urinary tract infection.  Physical therapy saw him recommended short-term rehab, for which she will decline.  He will be discharged with antibiotics and will need to follow-up with the orthopedic surgeon in 2 weeks.  Please continue to use her sling and do not use your right arm to lift objects.  Please make sure to obtain a primary care physician as I have started you on blood pressure medications.  Your blood pressure will need to be followed up on by physician.

## 2018-09-25 NOTE — Progress Notes (Signed)
PT Progress Note  Spoke with RN. Pt is declining SNF. Therefore, recommend HHPT and 24-hour supervision. No home equipment needed.  Lorrin Goodell, PT  Office # [phone] Pager [phone]

## 2018-09-25 NOTE — Discharge Summary (Signed)
Physician Discharge Summary  DEBBRAH SAMPEDRO BSJ:[id number] DOB: [birth year] DOA: 09/22/2018  PCP: System, Pcp Not In  Admit date: 09/22/2018 Discharge date: 09/25/2018  Admitted From: Home Disposition: Home  Recommendations for Outpatient Follow-up:  1. Follow up with PCP in 1 week (Patient states she will find a PCP) 2. Please obtain BMP/CBC in one week 3. Please follow up on the following pending results: None  Home Health: PT, OT Equipment/Devices: None  Discharge Condition: Stable CODE STATUS: Full code Diet recommendation: Heart healthy   Brief/Interim Summary:  Admission HPI written by Etta Quill, DO   Chief Complaint: Fall  HPI: JAMIL CASTILLO is a 83 y.o. female with medical history significant of SVT s/p ablation.  Patient only on vitamins as chronic med.  Patient thinks she may have fallen today but doesn't remember when she fell and cant really tell me much about past few days.  Her sister has been trying to contact her for several days. Neighbor went over and found the patient's house was disorganized and a strong smell of urine. Patient complaining of pain to the right side of her head and her right shoulder.   ED Course: Head lac and R AC joint separation found on work up.  Also UA demonstrates UTI.    Hospital course:  Fall Sounds like patient had a syncopal episode. No significant events on telemetry. No murmur on physical exam. Orthostatic vitals normal. Possibly precipitated by UTI. Patient is unsteady with physical therapy, but patient is declining SNF placement.   UTI Patient empirically treated with ceftriaxone.  Urine culture significant for E. coli.  Patient transition to cefdinir on discharge to complete a 7-day course of antibiotics.  Delirium Likely secondary to UTI. Appears resolved.  Injury of right acromioclavicular joint Non-weight bearing per orthopedic surgery recommendations.  Sling.  Patient to follow-up with Dr.  Tamera Punt in 2 weeks.  Hypertension Previously diagnosed. Not on medication as an outpatient except vitamins. Discontinue vitamins. Started on amlodipine 5 mg at night and increased to 10 mg dose on discharge.  Discharge Diagnoses:  Principal Problem:   Acute lower UTI Active Problems:   Essential hypertension   Delirium   Injury of right acromioclavicular joint    Discharge Instructions  Discharge Instructions    Call MD for:  difficulty breathing, headache or visual disturbances   Complete by:  As directed    Call MD for:  persistant dizziness or light-headedness   Complete by:  As directed    Call MD for:  temperature >100.4   Complete by:  As directed    Diet - low sodium heart healthy   Complete by:  As directed    Increase activity slowly   Complete by:  As directed      Allergies as of 09/25/2018      Reactions   Penicillins    Pt states she is not allergic to penicillin      Medication List    STOP taking these medications   OVER THE COUNTER MEDICATION     TAKE these medications   amLODipine 10 MG tablet Commonly known as:  NORVASC Take 1 tablet (10 mg total) by mouth at bedtime.   cefdinir 300 MG capsule Commonly known as:  OMNICEF Take 1 capsule (300 mg total) by mouth 2 (two) times daily for 4 days. Start taking on:  September 26, 2018      Follow-up Information    Malena Catholic, MD. Schedule an appointment as  soon as possible for a visit in 2 week(s).   Why:  orthopedic surgery follow up for shoulder  Contact information: [address] [place] [address] [phone]          Allergies  Allergen Reactions  . Penicillins     Pt states she is not allergic to penicillin    Consultations:  Orthopedic surgery   Procedures/Studies: Dg Shoulder Right  Result Date: 09/22/2018 CLINICAL DATA:  Recent fall with right shoulder pain, initial encounter EXAM: RIGHT SHOULDER - 2+ VIEW COMPARISON:  None. FINDINGS: Mild widening of the  acromioclavicular joint is seen with a small bony density adjacent to the superior aspect of the acromion likely representing a small avulsion fracture. The humeral head is well seated. The underlying bony thorax is unremarkable. IMPRESSION: Widening of the acromioclavicular joint likely related to the recent injury with what appears to be an avulsion from the acromion superiorly. Electronically Signed   By: Inez Catalina M.D.   On: 09/22/2018 23:10   Ct Head Wo Contrast  Result Date: 09/22/2018 CLINICAL DATA:  Recent fall with right-sided scalp laceration EXAM: CT HEAD WITHOUT CONTRAST CT CERVICAL SPINE WITHOUT CONTRAST TECHNIQUE: Multidetector CT imaging of the head and cervical spine was performed following the standard protocol without intravenous contrast. Multiplanar CT image reconstructions of the cervical spine were also generated. COMPARISON:  08/15/2008 FINDINGS: CT HEAD FINDINGS Brain: Diffuse atrophic changes are identified. Bilateral basal ganglia lacunar infarcts are again identified and stable. No findings to suggest acute hemorrhage, acute infarction or space-occupying mass lesion are noted. Vascular: No hyperdense vessel or unexpected calcification. Skull: Normal. Negative for fracture or focal lesion. Sinuses/Orbits: No acute finding. Other: None. CT CERVICAL SPINE FINDINGS Alignment: Within normal limits. Skull base and vertebrae: 7 cervical segments are well visualized. Multilevel osteophytic changes and facet hypertrophic changes are seen. No acute fracture or acute facet abnormality is noted. Soft tissues and spinal canal: Surrounding soft tissues are within normal limits. Upper chest: Within normal limits. Other: None IMPRESSION: CT of the head: Chronic atrophic and ischemic changes without acute abnormality. CT of the cervical spine: Degenerative change without acute abnormality. Electronically Signed   By: Inez Catalina M.D.   On: 09/22/2018 23:20   Ct Cervical Spine Wo  Contrast  Result Date: 09/22/2018 CLINICAL DATA:  Recent fall with right-sided scalp laceration EXAM: CT HEAD WITHOUT CONTRAST CT CERVICAL SPINE WITHOUT CONTRAST TECHNIQUE: Multidetector CT imaging of the head and cervical spine was performed following the standard protocol without intravenous contrast. Multiplanar CT image reconstructions of the cervical spine were also generated. COMPARISON:  08/15/2008 FINDINGS: CT HEAD FINDINGS Brain: Diffuse atrophic changes are identified. Bilateral basal ganglia lacunar infarcts are again identified and stable. No findings to suggest acute hemorrhage, acute infarction or space-occupying mass lesion are noted. Vascular: No hyperdense vessel or unexpected calcification. Skull: Normal. Negative for fracture or focal lesion. Sinuses/Orbits: No acute finding. Other: None. CT CERVICAL SPINE FINDINGS Alignment: Within normal limits. Skull base and vertebrae: 7 cervical segments are well visualized. Multilevel osteophytic changes and facet hypertrophic changes are seen. No acute fracture or acute facet abnormality is noted. Soft tissues and spinal canal: Surrounding soft tissues are within normal limits. Upper chest: Within normal limits. Other: None IMPRESSION: CT of the head: Chronic atrophic and ischemic changes without acute abnormality. CT of the cervical spine: Degenerative change without acute abnormality. Electronically Signed   By: Inez Catalina M.D.   On: 09/22/2018 23:20      Subjective: No issues today.  Discharge Exam: Vitals:   09/25/18 0551 09/25/18 0907  BP: (!) 173/75 (!) 189/99  Pulse: 79 80  Resp: 18 18  Temp: 98.4 F (36.9 C) 98.2 F (36.8 C)  SpO2: 94% 98%   Vitals:   09/24/18 1551 09/24/18 2009 09/25/18 0551 09/25/18 0907  BP: (!) 163/91 (!) 163/87 (!) 173/75 (!) 189/99  Pulse: 86 87 79 80  Resp: 18 18 18 18   Temp:  98.9 F (37.2 C) 98.4 F (36.9 C) 98.2 F (36.8 C)  TempSrc:  Oral Oral Oral  SpO2: 98% 97% 94% 98%  Weight:  68 kg     Height:        General: Pt is alert, awake, not in acute distress Cardiovascular: RRR, S1/S2 +, no rubs, no gallops Respiratory: CTA bilaterally, no wheezing, no rhonchi Abdominal: Soft, NT, ND, bowel sounds + Extremities: no edema, no cyanosis    The results of significant diagnostics from this hospitalization (including imaging, microbiology, ancillary and laboratory) are listed below for reference.     Microbiology: Recent Results (from the past 240 hour(s))  Culture, Urine     Status: Abnormal   Collection Time: 09/23/18  1:01 AM  Result Value Ref Range Status   Specimen Description URINE, RANDOM  Final   Special Requests   Final    NONE Performed at [facility], [address] [place], [place] [address]    Culture >=100,000 COLONIES/mL ESCHERICHIA COLI (A)  Final   Report Status 09/25/2018 FINAL  Final   Organism ID, Bacteria ESCHERICHIA COLI (A)  Final      Susceptibility   Escherichia coli - MIC*    AMPICILLIN >=32 RESISTANT Resistant     CEFAZOLIN <=4 SENSITIVE Sensitive     CEFTRIAXONE <=1 SENSITIVE Sensitive     CIPROFLOXACIN >=4 RESISTANT Resistant     GENTAMICIN <=1 SENSITIVE Sensitive     IMIPENEM <=0.25 SENSITIVE Sensitive     NITROFURANTOIN <=16 SENSITIVE Sensitive     TRIMETH/SULFA <=20 SENSITIVE Sensitive     AMPICILLIN/SULBACTAM >=32 RESISTANT Resistant     PIP/TAZO <=4 SENSITIVE Sensitive     Extended ESBL NEGATIVE Sensitive     * >=100,000 COLONIES/mL ESCHERICHIA COLI     Labs: BNP (last 3 results) No results for input(s): BNP in the last 8760 hours. Basic Metabolic Panel: Recent Labs  Lab 09/22/18 2209 09/23/18 0419  NA 140 140  K 3.7 4.1  CL 103 105  CO2 28 22  GLUCOSE 122* 103*  BUN 20 19  CREATININE 0.89 0.85  CALCIUM 8.9 8.7*   Liver Function Tests: Recent Labs  Lab 09/22/18 2209  AST 36  ALT 23  ALKPHOS 58  BILITOT 2.0*  PROT 6.9  ALBUMIN 3.5   No results for input(s): LIPASE, AMYLASE in the last 168  hours. Recent Labs  Lab 09/23/18 0016  AMMONIA 21   CBC: Recent Labs  Lab 09/22/18 2209 09/23/18 0419  WBC 11.8* 11.0*  NEUTROABS 8.5*  --   HGB 12.7 11.9*  HCT 40.7 36.7  MCV 90.0 89.3  PLT 166 167   Cardiac Enzymes: Recent Labs  Lab 09/22/18 2209  CKTOTAL 569*   BNP: Invalid input(s): POCBNP CBG: No results for input(s): GLUCAP in the last 168 hours. D-Dimer No results for input(s): DDIMER in the last 72 hours. Hgb A1c No results for input(s): HGBA1C in the last 72 hours. Lipid Profile No results for input(s): CHOL, HDL, LDLCALC, TRIG, CHOLHDL, LDLDIRECT in the last 72 hours. Thyroid  function studies Recent Labs    09/23/18 0008  TSH 1.014   Anemia work up No results for input(s): VITAMINB12, FOLATE, FERRITIN, TIBC, IRON, RETICCTPCT in the last 72 hours. Urinalysis    Component Value Date/Time   COLORURINE AMBER (A) 09/22/2018 2352   APPEARANCEUR CLOUDY (A) 09/22/2018 2352   LABSPEC 1.017 09/22/2018 2352   PHURINE 5.0 09/22/2018 2352   GLUCOSEU NEGATIVE 09/22/2018 2352   HGBUR SMALL (A) 09/22/2018 2352   BILIRUBINUR NEGATIVE 09/22/2018 2352   KETONESUR 5 (A) 09/22/2018 2352   PROTEINUR 30 (A) 09/22/2018 2352   UROBILINOGEN 0.2 08/15/2008 2143   NITRITE NEGATIVE 09/22/2018 2352   LEUKOCYTESUR LARGE (A) 09/22/2018 2352   Sepsis Labs Invalid input(s): PROCALCITONIN,  WBC,  LACTICIDVEN Microbiology Recent Results (from the past 240 hour(s))  Culture, Urine     Status: Abnormal   Collection Time: 09/23/18  1:01 AM  Result Value Ref Range Status   Specimen Description URINE, RANDOM  Final   Special Requests   Final    NONE Performed at [facility], [place] [address], [place], [place] [address]    Culture >=100,000 COLONIES/mL ESCHERICHIA COLI (A)  Final   Report Status 09/25/2018 FINAL  Final   Organism ID, Bacteria ESCHERICHIA COLI (A)  Final      Susceptibility   Escherichia coli - MIC*    AMPICILLIN >=32 RESISTANT Resistant     CEFAZOLIN  <=4 SENSITIVE Sensitive     CEFTRIAXONE <=1 SENSITIVE Sensitive     CIPROFLOXACIN >=4 RESISTANT Resistant     GENTAMICIN <=1 SENSITIVE Sensitive     IMIPENEM <=0.25 SENSITIVE Sensitive     NITROFURANTOIN <=16 SENSITIVE Sensitive     TRIMETH/SULFA <=20 SENSITIVE Sensitive     AMPICILLIN/SULBACTAM >=32 RESISTANT Resistant     PIP/TAZO <=4 SENSITIVE Sensitive     Extended ESBL NEGATIVE Sensitive     * >=100,000 COLONIES/mL ESCHERICHIA COLI    SIGNED:   Cordelia Poche, MD [facility] 09/25/2018, 11:36 AM

## 2019-04-23 ENCOUNTER — Inpatient Hospital Stay (HOSPITAL_COMMUNITY)
Admission: EM | Admit: 2019-04-23 | Discharge: 2019-04-29 | DRG: 092 | Disposition: A | Discharge status: Skilled Nursing Facility | Payer: Medicare Other | Attending: Internal Medicine | Admitting: Internal Medicine

## 2019-04-23 ENCOUNTER — Other Ambulatory Visit: Payer: Self-pay

## 2019-04-23 ENCOUNTER — Emergency Department (HOSPITAL_COMMUNITY): Payer: Medicare Other

## 2019-04-23 ENCOUNTER — Encounter (HOSPITAL_COMMUNITY): Payer: Self-pay | Admitting: Emergency Medicine

## 2019-04-23 DIAGNOSIS — I471 Supraventricular tachycardia: Secondary | ICD-10-CM | POA: Diagnosis present

## 2019-04-23 DIAGNOSIS — G92 Toxic encephalopathy: Secondary | ICD-10-CM | POA: Diagnosis not present

## 2019-04-23 DIAGNOSIS — T428X5A Adverse effect of antiparkinsonism drugs and other central muscle-tone depressants, initial encounter: Secondary | ICD-10-CM | POA: Diagnosis present

## 2019-04-23 DIAGNOSIS — Y92009 Unspecified place in unspecified non-institutional (private) residence as the place of occurrence of the external cause: Secondary | ICD-10-CM

## 2019-04-23 DIAGNOSIS — S060X9D Concussion with loss of consciousness of unspecified duration, subsequent encounter: Secondary | ICD-10-CM | POA: Diagnosis not present

## 2019-04-23 DIAGNOSIS — G9341 Metabolic encephalopathy: Secondary | ICD-10-CM | POA: Diagnosis not present

## 2019-04-23 DIAGNOSIS — Z20828 Contact with and (suspected) exposure to other viral communicable diseases: Secondary | ICD-10-CM | POA: Diagnosis present

## 2019-04-23 DIAGNOSIS — B9620 Unspecified Escherichia coli [E. coli] as the cause of diseases classified elsewhere: Secondary | ICD-10-CM | POA: Diagnosis present

## 2019-04-23 DIAGNOSIS — N2889 Other specified disorders of kidney and ureter: Secondary | ICD-10-CM | POA: Diagnosis not present

## 2019-04-23 DIAGNOSIS — R443 Hallucinations, unspecified: Secondary | ICD-10-CM | POA: Diagnosis present

## 2019-04-23 DIAGNOSIS — C649 Malignant neoplasm of unspecified kidney, except renal pelvis: Secondary | ICD-10-CM | POA: Diagnosis present

## 2019-04-23 DIAGNOSIS — G9340 Encephalopathy, unspecified: Secondary | ICD-10-CM

## 2019-04-23 DIAGNOSIS — I160 Hypertensive urgency: Secondary | ICD-10-CM | POA: Diagnosis not present

## 2019-04-23 DIAGNOSIS — Z8249 Family history of ischemic heart disease and other diseases of the circulatory system: Secondary | ICD-10-CM | POA: Diagnosis not present

## 2019-04-23 DIAGNOSIS — Z79899 Other long term (current) drug therapy: Secondary | ICD-10-CM | POA: Diagnosis not present

## 2019-04-23 DIAGNOSIS — I10 Essential (primary) hypertension: Secondary | ICD-10-CM | POA: Diagnosis not present

## 2019-04-23 DIAGNOSIS — N200 Calculus of kidney: Secondary | ICD-10-CM | POA: Diagnosis present

## 2019-04-23 DIAGNOSIS — Z841 Family history of disorders of kidney and ureter: Secondary | ICD-10-CM | POA: Diagnosis not present

## 2019-04-23 DIAGNOSIS — M5032 Other cervical disc degeneration, mid-cervical region, unspecified level: Secondary | ICD-10-CM | POA: Diagnosis present

## 2019-04-23 DIAGNOSIS — I161 Hypertensive emergency: Secondary | ICD-10-CM | POA: Diagnosis present

## 2019-04-23 DIAGNOSIS — E860 Dehydration: Secondary | ICD-10-CM | POA: Diagnosis present

## 2019-04-23 DIAGNOSIS — M4856XA Collapsed vertebra, not elsewhere classified, lumbar region, initial encounter for fracture: Secondary | ICD-10-CM | POA: Diagnosis present

## 2019-04-23 DIAGNOSIS — N390 Urinary tract infection, site not specified: Secondary | ICD-10-CM | POA: Diagnosis present

## 2019-04-23 LAB — URINALYSIS, ROUTINE W REFLEX MICROSCOPIC
Bilirubin Urine: NEGATIVE
Glucose, UA: NEGATIVE mg/dL
Ketones, ur: NEGATIVE mg/dL
Nitrite: POSITIVE — AB
Protein, ur: 30 mg/dL — AB
Specific Gravity, Urine: 1.013 (ref 1.005–1.030)
pH: 5 (ref 5.0–8.0)

## 2019-04-23 LAB — CBC WITH DIFFERENTIAL/PLATELET
Abs Immature Granulocytes: 0.05 10*3/uL (ref 0.00–0.07)
Basophils Absolute: 0 10*3/uL (ref 0.0–0.1)
Basophils Relative: 1 %
Eosinophils Absolute: 0.1 10*3/uL (ref 0.0–0.5)
Eosinophils Relative: 1 %
HCT: 39.5 % (ref 36.0–46.0)
Hemoglobin: 12.4 g/dL (ref 12.0–15.0)
Immature Granulocytes: 1 %
Lymphocytes Relative: 26 %
Lymphs Abs: 2.1 10*3/uL (ref 0.7–4.0)
MCH: 28.4 pg (ref 26.0–34.0)
MCHC: 31.4 g/dL (ref 30.0–36.0)
MCV: 90.4 fL (ref 80.0–100.0)
Monocytes Absolute: 0.9 10*3/uL (ref 0.1–1.0)
Monocytes Relative: 11 %
Neutro Abs: 5 10*3/uL (ref 1.7–7.7)
Neutrophils Relative %: 60 %
Platelets: 156 10*3/uL (ref 150–400)
RBC: 4.37 MIL/uL (ref 3.87–5.11)
RDW: 14 % (ref 11.5–15.5)
WBC: 8.2 10*3/uL (ref 4.0–10.5)
nRBC: 0 % (ref 0.0–0.2)

## 2019-04-23 LAB — COMPREHENSIVE METABOLIC PANEL
ALT: 19 U/L (ref 0–44)
AST: 17 U/L (ref 15–41)
Albumin: 3.4 g/dL — ABNORMAL LOW (ref 3.5–5.0)
Alkaline Phosphatase: 76 U/L (ref 38–126)
Anion gap: 9 (ref 5–15)
BUN: 16 mg/dL (ref 8–23)
CO2: 23 mmol/L (ref 22–32)
Calcium: 8.8 mg/dL — ABNORMAL LOW (ref 8.9–10.3)
Chloride: 110 mmol/L (ref 98–111)
Creatinine, Ser: 0.78 mg/dL (ref 0.44–1.00)
GFR calc Af Amer: >60 mL/min (ref >60)
GFR calc non Af Amer: >60 mL/min (ref >60)
Glucose, Bld: 106 mg/dL — ABNORMAL HIGH (ref 70–99)
Potassium: 3.8 mmol/L (ref 3.5–5.1)
Sodium: 142 mmol/L (ref 135–145)
Total Bilirubin: 1.1 mg/dL (ref 0.3–1.2)
Total Protein: 6.6 g/dL (ref 6.5–8.1)

## 2019-04-23 LAB — RAPID URINE DRUG SCREEN, HOSP PERFORMED
Amphetamines: NOT DETECTED
Barbiturates: NOT DETECTED
Benzodiazepines: NOT DETECTED
Cocaine: NOT DETECTED
Opiates: NOT DETECTED
Tetrahydrocannabinol: NOT DETECTED

## 2019-04-23 LAB — ETHANOL: Alcohol, Ethyl (B): <10 mg/dL (ref <10)

## 2019-04-23 LAB — CBG MONITORING, ED: Glucose-Capillary: 86 mg/dL (ref 70–99)

## 2019-04-23 LAB — SARS CORONAVIRUS 2 BY RT PCR (HOSPITAL ORDER, PERFORMED IN ~~LOC~~ HOSPITAL LAB): SARS Coronavirus 2: NEGATIVE

## 2019-04-23 MED ORDER — AMLODIPINE BESYLATE 10 MG PO TABS
10.0000 mg | ORAL_TABLET | Freq: Every day | ORAL | Status: DC
  Administered 2019-04-24 – 2019-04-28 (×6): 10 mg via ORAL
  Filled 2019-04-23 (×6): qty 1

## 2019-04-23 MED ORDER — ENOXAPARIN SODIUM 40 MG/0.4ML ~~LOC~~ SOLN
40.0000 mg | SUBCUTANEOUS | Status: DC
  Administered 2019-04-24 – 2019-04-28 (×6): 40 mg via SUBCUTANEOUS
  Filled 2019-04-23 (×6): qty 0.4

## 2019-04-23 MED ORDER — SODIUM CHLORIDE 0.9 % IV SOLN
1.0000 g | Freq: Once | INTRAVENOUS | Status: AC
  Administered 2019-04-23: 1 g via INTRAVENOUS
  Filled 2019-04-23: qty 10

## 2019-04-23 MED ORDER — HYDRALAZINE HCL 25 MG PO TABS: 25.0000 mg | ORAL_TABLET | ORAL | Status: DC | PRN

## 2019-04-23 MED ORDER — ACETAMINOPHEN 325 MG PO TABS
650.0000 mg | ORAL_TABLET | Freq: Once | ORAL | Status: AC
  Administered 2019-04-23: 650 mg via ORAL
  Filled 2019-04-23: qty 2

## 2019-04-23 MED ORDER — VITAMIN D 25 MCG (1000 UNIT) PO TABS
1000.0000 [IU] | ORAL_TABLET | Freq: Every day | ORAL | Status: DC
  Administered 2019-04-24 – 2019-04-29 (×6): 1000 [IU] via ORAL
  Filled 2019-04-23 (×6): qty 1

## 2019-04-23 MED ORDER — SODIUM CHLORIDE 0.9 % IV SOLN
INTRAVENOUS | Status: DC
  Administered 2019-04-24: 01:00:00 via INTRAVENOUS

## 2019-04-23 MED ORDER — SODIUM CHLORIDE 0.9 % IV BOLUS
500.0000 mL | Freq: Once | INTRAVENOUS | Status: AC
  Administered 2019-04-23: 500 mL via INTRAVENOUS

## 2019-04-23 MED ORDER — HYDROCODONE-ACETAMINOPHEN 5-325 MG PO TABS
1.0000 | ORAL_TABLET | ORAL | Status: DC | PRN
  Administered 2019-04-24 (×3): 1 via ORAL
  Filled 2019-04-23 (×4): qty 1

## 2019-04-23 MED ORDER — QUETIAPINE FUMARATE 25 MG PO TABS
12.5000 mg | ORAL_TABLET | Freq: Two times a day (BID) | ORAL | Status: DC
  Administered 2019-04-24 (×2): 12.5 mg via ORAL
  Filled 2019-04-23 (×2): qty 1

## 2019-04-23 MED ORDER — VITAMIN C 500 MG PO TABS
500.0000 mg | ORAL_TABLET | Freq: Every day | ORAL | Status: DC
  Administered 2019-04-24 – 2019-04-29 (×6): 500 mg via ORAL
  Filled 2019-04-23 (×6): qty 1

## 2019-04-23 MED ORDER — SODIUM CHLORIDE 0.9 % IV SOLN
1.0000 g | INTRAVENOUS | Status: DC
  Administered 2019-04-24: 1 g via INTRAVENOUS
  Filled 2019-04-23: qty 10
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
## 2019-04-24 ENCOUNTER — Inpatient Hospital Stay (HOSPITAL_COMMUNITY): Payer: Medicare Other

## 2019-04-24 DIAGNOSIS — I10 Essential (primary) hypertension: Secondary | ICD-10-CM

## 2019-04-24 DIAGNOSIS — N390 Urinary tract infection, site not specified: Secondary | ICD-10-CM

## 2019-04-24 LAB — CBC
HCT: 36.3 % (ref 36.0–46.0)
Hemoglobin: 11.7 g/dL — ABNORMAL LOW (ref 12.0–15.0)
MCH: 28.3 pg (ref 26.0–34.0)
MCHC: 32.2 g/dL (ref 30.0–36.0)
MCV: 87.9 fL (ref 80.0–100.0)
Platelets: 146 10*3/uL — ABNORMAL LOW (ref 150–400)
RBC: 4.13 MIL/uL (ref 3.87–5.11)
RDW: 13.9 % (ref 11.5–15.5)
WBC: 7.9 10*3/uL (ref 4.0–10.5)
nRBC: 0 % (ref 0.0–0.2)

## 2019-04-24 LAB — BASIC METABOLIC PANEL
Anion gap: 9 (ref 5–15)
BUN: 14 mg/dL (ref 8–23)
CO2: 23 mmol/L (ref 22–32)
Calcium: 8.5 mg/dL — ABNORMAL LOW (ref 8.9–10.3)
Chloride: 110 mmol/L (ref 98–111)
Creatinine, Ser: 0.76 mg/dL (ref 0.44–1.00)
GFR calc Af Amer: >60 mL/min (ref >60)
GFR calc non Af Amer: >60 mL/min (ref >60)
Glucose, Bld: 93 mg/dL (ref 70–99)
Potassium: 3.2 mmol/L — ABNORMAL LOW (ref 3.5–5.1)
Sodium: 142 mmol/L (ref 135–145)

## 2019-04-24 LAB — CREATININE, SERUM
Creatinine, Ser: 0.72 mg/dL (ref 0.44–1.00)
GFR calc Af Amer: >60 mL/min (ref >60)
GFR calc non Af Amer: >60 mL/min (ref >60)

## 2019-04-24 LAB — MAGNESIUM: Magnesium: 2 mg/dL (ref 1.7–2.4)

## 2019-04-24 LAB — PHOSPHORUS: Phosphorus: 3.4 mg/dL (ref 2.5–4.6)

## 2019-04-24 MED ORDER — POTASSIUM CHLORIDE CRYS ER 20 MEQ PO TBCR
40.0000 meq | EXTENDED_RELEASE_TABLET | Freq: Once | ORAL | Status: AC
  Administered 2019-04-24: 40 meq via ORAL
  Filled 2019-04-24: qty 2

## 2019-04-24 MED ORDER — HYDRALAZINE HCL 50 MG PO TABS
50.0000 mg | ORAL_TABLET | Freq: Three times a day (TID) | ORAL | Status: DC
  Administered 2019-04-24 (×3): 50 mg via ORAL
  Filled 2019-04-24 (×3): qty 1

## 2019-04-24 MED ORDER — GADOBUTROL 1 MMOL/ML IV SOLN
7.5000 mL | Freq: Once | INTRAVENOUS | Status: AC | PRN
  Administered 2019-04-24: 7.5 mL via INTRAVENOUS

## 2019-04-24 NOTE — Progress Notes (Signed)
Spoke to Niece [name] who says contact in chart was her mom who is deceased and updated information, pt lives alone and has stairs which will be difficult this time, awaiting PT/OT
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
## 2019-04-25 ENCOUNTER — Inpatient Hospital Stay (HOSPITAL_COMMUNITY): Payer: Medicare Other

## 2019-04-25 LAB — URINE CULTURE: Culture: >=100000 — AB

## 2019-04-25 LAB — BASIC METABOLIC PANEL
Anion gap: 10 (ref 5–15)
BUN: 12 mg/dL (ref 8–23)
CO2: 22 mmol/L (ref 22–32)
Calcium: 8.9 mg/dL (ref 8.9–10.3)
Chloride: 108 mmol/L (ref 98–111)
Creatinine, Ser: 0.87 mg/dL (ref 0.44–1.00)
GFR calc Af Amer: >60 mL/min (ref >60)
GFR calc non Af Amer: >60 mL/min (ref >60)
Glucose, Bld: 107 mg/dL — ABNORMAL HIGH (ref 70–99)
Potassium: 4.4 mmol/L (ref 3.5–5.1)
Sodium: 140 mmol/L (ref 135–145)

## 2019-04-25 MED ORDER — IOHEXOL 300 MG/ML  SOLN
100.0000 mL | Freq: Once | INTRAMUSCULAR | Status: AC | PRN
  Administered 2019-04-25: 100 mL via INTRAVENOUS

## 2019-04-25 MED ORDER — CEPHALEXIN 250 MG PO CAPS: 500.0000 mg | ORAL_CAPSULE | Freq: Three times a day (TID) | ORAL | Status: DC

## 2019-04-25 MED ORDER — CEFDINIR 300 MG PO CAPS
300.0000 mg | ORAL_CAPSULE | Freq: Two times a day (BID) | ORAL | Status: DC
  Administered 2019-04-25 – 2019-04-26 (×3): 300 mg via ORAL
  Filled 2019-04-25 (×4): qty 1

## 2019-04-25 MED ORDER — CARVEDILOL 6.25 MG PO TABS
6.2500 mg | ORAL_TABLET | Freq: Two times a day (BID) | ORAL | Status: DC
  Administered 2019-04-25 – 2019-04-29 (×8): 6.25 mg via ORAL
  Filled 2019-04-25 (×9): qty 1

## 2019-04-25 NOTE — Social Work (Signed)
HIPAA compliant message left for pt niece [name] at [phone].  CSW continuing to follow for support with disposition when medically appropriate.  Westley Hummer, MSW, [place] Work [phone]

## 2019-04-25 NOTE — Progress Notes (Signed)
Patient had a quick run of SVT, RN checked on pt, pt is still confused. No changes in LOC, or anything else.
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
## 2019-04-25 NOTE — Progress Notes (Signed)
Patient " brother call  Request MD to call him back to up date about patient, his phone number [phone]. Thanks.
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
## 2019-04-25 NOTE — TOC Initial Note (Signed)
Transition of Care [facility]) - Initial/Assessment Note    Patient Details  Name: Alicia Richardson MRN: [id number] Date of Birth: [birth year]  Transition of Care [facility]) CM/SW Contact:    Alicia Richardson, [place] Phone Number: 04/25/2019, 2:29 PM  Clinical Narrative:                 CSW spoke with pt niece Alicia Richardson at [phone]. Pt from home alone, she does have supportive family that check in on her but at current time pt does not have 24/7 assistance and pt niece feels SNF may be the best to get stronger prior to returning back to community. Pt niece has considered taking her home with her in [place] once she is able to regain some of her strength. We discussed SNF referral process and Medicare authorization and approval prior to discharge. Pt niece is interested in referral to be sent out to [facility] and offers to be sent to her at [email or web address]   Expected Discharge Plan: Skilled Nursing Facility Barriers to Discharge: Awaiting State Approval (PASRR), Insurance Authorization, Continued Medical Work up   Patient Goals and CMS Choice Patient states their goals for this hospitalization and ongoing recovery are:: "for her to get stronger before she goes home"- pt neice CMS Medicare.gov Compare Post Acute Care list provided to:: Patient Represenative (must comment) Choice offered to / list presented to : (pt niece)  Expected Discharge Plan and Services Expected Discharge Plan: [place] In-house Referral: Clinical Social Work   Post Acute Care Choice: [place] Living arrangements for the past 2 months: [place]                                      Prior Living Arrangements/Services Living arrangements for the past 2 months: Single Family Home Lives with:: Self Patient language and need for interpreter reviewed:: Yes(no needs) Do you feel safe going back to the place where you live?: No   pt family would like for her to come home  with them after SNF  Need for Family Participation in Patient Care: Yes (Comment)(assistance with adls/iadls and decision making) Care giver support system in place?: Yes (comment)(siblings and nieces/nephews) Current home services: DME Criminal Activity/Legal Involvement Pertinent to Current Situation/Hospitalization: No - Comment as needed  Activities of Daily Living Home Assistive Devices/Equipment: Cane (specify quad or straight)(straight) ADL Screening (condition at time of admission) Patient's cognitive ability adequate to safely complete daily activities?: No Is the patient deaf or have difficulty hearing?: No Does the patient have difficulty seeing, even when wearing glasses/contacts?: No Does the patient have difficulty concentrating, remembering, or making decisions?: No Patient able to express need for assistance with ADLs?: Yes Does the patient have difficulty dressing or bathing?: No Independently performs ADLs?: Yes (appropriate for developmental age) Does the patient have difficulty walking or climbing stairs?: Yes Weakness of Legs: Both Weakness of Arms/Hands: None  Permission Sought/Granted Permission sought to share information with : Facility Sport and exercise psychologist, Family Supports Permission granted to share information with : No(pt only a&o x2)  Share Information with NAME: Alicia Richardson  Permission granted to share info w AGENCY: SNFs  Permission granted to share info w Relationship: neice  Permission granted to share info w Contact Information: [phone]  Emotional Assessment Appearance:: Other (Comment Required(telephonic assessment with pt niece) Attitude/Demeanor/Rapport: (telephonic assessment with pt niece) Affect (typically observed): (telephonic assessment with pt niece)  Orientation: : Oriented to Self, Oriented to Place, Fluctuating Orientation (Suspected and/or reported Sundowners) Alcohol / Substance Use: Not Applicable Psych Involvement: No  (comment)  Admission diagnosis:  Acute UTI (urinary tract infection) [N39.0] Patient Active Problem List   Diagnosis Date Noted  . Acute encephalopathy 04/23/2019  . Acute lower UTI 09/23/2018  . Delirium 09/23/2018  . Injury of right acromioclavicular joint 09/23/2018  . Essential hypertension 06/02/2009  . SUPRAVENTRICULAR TACHYCARDIA 06/02/2009  . PALPITATIONS 06/02/2009   PCP:  Alicia Shanks, MD Pharmacy:   [id number] [address], [place] [address] Phone: [phone] Fax: [phone]     Social Determinants of Health (SDOH) Interventions    Readmission Risk Interventions No flowsheet data found.

## 2019-04-25 NOTE — NC FL2 (Signed)
[place] LEVEL OF CARE SCREENING TOOL     IDENTIFICATION  Patient Name: Alicia Richardson Birthdate: July 25, 1935 Sex: female Admission Date (Current Location): 04/23/2019  [facility] and [place] Number:  Herbalist and Address:  [facility], [place] [address] [place], [place] [address]      Provider Number: [id number]  Attending Physician Name and Address:  Kinnie Feil, MD  Relative Name and Phone Number:  [name]; niece; [phone]    Current Level of Care: Hospital Recommended Level of Care: [place] Prior Approval Number:    Date Approved/Denied:   PASRR Number: [id number]  Discharge Plan: SNF    Current Diagnoses: Patient Active Problem List   Diagnosis Date Noted  . Acute encephalopathy 04/23/2019  . Acute lower UTI 09/23/2018  . Delirium 09/23/2018  . Injury of right acromioclavicular joint 09/23/2018  . Essential hypertension 06/02/2009  . SUPRAVENTRICULAR TACHYCARDIA 06/02/2009  . PALPITATIONS 06/02/2009    Orientation RESPIRATION BLADDER Height & Weight     Self, Place  Normal Continent, External catheter Weight: 156 lb 1.4 oz (70.8 kg) Height:  5\' 2"  (157.5 cm)  BEHAVIORAL SYMPTOMS/MOOD NEUROLOGICAL BOWEL NUTRITION STATUS      Continent Diet(see discharge summary)  AMBULATORY STATUS COMMUNICATION OF NEEDS Skin   Extensive Assist Verbally Other (Comment)(generalized ecchymosis on chest, abdomen and arms)                       Personal Care Assistance Level of Assistance  Bathing, Feeding, Dressing Bathing Assistance: Maximum assistance Feeding assistance: Independent Dressing Assistance: Maximum assistance     Functional Limitations Info  Sight, Hearing, Speech Sight Info: Adequate Hearing Info: Adequate Speech Info: Adequate    SPECIAL CARE FACTORS FREQUENCY  OT (By licensed OT), PT (By licensed PT)     PT Frequency: 5x week OT Frequency: 5x week             Contractures Contractures Info: Not present    Additional Factors Info  Code Status, Allergies Code Status Info: Full Code Allergies Info: Penicillins           Current Medications (04/25/2019):  This is the current hospital active medication list Current Facility-Administered Medications  Medication Dose Route Frequency Provider Last Rate Last Dose  . amLODipine (NORVASC) tablet 10 mg  10 mg Oral QHS Dana Allan I, MD   10 mg at 04/24/19 2255  . carvedilol (COREG) tablet 6.25 mg  6.25 mg Oral BID WC Kinnie Feil, MD   6.25 mg at 04/25/19 1115  . cefdinir (OMNICEF) capsule 300 mg  300 mg Oral Q12H Kinnie Feil, MD   300 mg at 04/25/19 1115  . cholecalciferol (VITAMIN D3) tablet 1,000 Units  1,000 Units Oral Daily Dana Allan I, MD   1,000 Units at 04/25/19 1115  . enoxaparin (LOVENOX) injection 40 mg  40 mg Subcutaneous Q24H Dana Allan I, MD   40 mg at 04/24/19 2258  . HYDROcodone-acetaminophen (NORCO/VICODIN) 5-325 MG per tablet 1 tablet  1 tablet Oral Q4H PRN Bonnell Public, MD   1 tablet at 04/24/19 2255  . vitamin C (ASCORBIC ACID) tablet 500 mg  500 mg Oral Daily Dana Allan I, MD   500 mg at 04/25/19 1115     Discharge Medications: Please see discharge summary for a list of discharge medications.  Relevant Imaging Results:  Relevant Lab Results:   Additional Information SS#[id number]  H Ranette Luckadoo, LCSWA
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
## 2019-04-25 NOTE — Plan of Care (Signed)
  Problem: Health Behavior/Discharge Planning: Goal: Ability to manage health-related needs will improve Outcome: Progressing   Problem: Nutrition: Goal: Adequate nutrition will be maintained Outcome: Progressing   Problem: Elimination: Goal: Will not experience complications related to urinary retention Outcome: Progressing   Problem: Pain Managment: Goal: General experience of comfort will improve Outcome: Progressing   Problem: Safety: Goal: Ability to remain free from injury will improve Outcome: Progressing

## 2019-04-25 NOTE — Progress Notes (Signed)
Pt hr up to 140 on telemetry, pt was working with PT  Getting off bedside commode, pt asx, hr back to 110's when sitting in chair, Dr Daleen Bo making rounds and in to see pt

## 2019-04-26 MED ORDER — CEFDINIR 300 MG PO CAPS
300.0000 mg | ORAL_CAPSULE | Freq: Two times a day (BID) | ORAL | Status: DC
  Administered 2019-04-26 – 2019-04-29 (×6): 300 mg via ORAL
  Filled 2019-04-26 (×7): qty 1

## 2019-04-26 NOTE — Plan of Care (Signed)
  Problem: Education: Goal: Knowledge of General Education information will improve Description: Including pain rating scale, medication(s)/side effects and non-pharmacologic comfort measures Outcome: Progressing   Problem: Coping: Goal: Level of anxiety will decrease Outcome: Progressing   Problem: Nutrition: Goal: Adequate nutrition will be maintained Outcome: Progressing   Problem: Pain Managment: Goal: General experience of comfort will improve Outcome: Progressing   Problem: Safety: Goal: Ability to remain free from injury will improve Outcome: Progressing

## 2019-04-26 NOTE — Progress Notes (Signed)
Updated pts. Family regarding, spoke with [name] ( pts niece).
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
## 2019-04-26 NOTE — TOC Progression Note (Addendum)
Transition of Care [facility]) - Progression Note    Patient Details  Name: SEAN MACWILLIAMS MRN: [id number] Date of Birth: [birth year]  Transition of Care [facility]) CM/SW Contact  Wandra Feinstein [place], [place] Phone Number: 04/26/2019, 1:56 PM  Clinical Narrative:   Provided current SNF offers [facility], [facility], [facility]) to pt's niece [name] [phone]. Pt's niece to research offers and notify SW of choice. Will need auth once niece makes decision. PASRR number received ([id number]). SW will provide updates as available.   Wandra Feinstein, MSW, LCSW [phone] (3E coverage)        Expected Discharge Plan: [place] Barriers to Discharge: [place] (PASRR), Insurance Authorization, Continued Medical Work up  Expected Discharge Plan and Services Expected Discharge Plan: [place] In-house Referral: Clinical Social Work   Post Acute Care Choice: [place] Living arrangements for the past 2 months: Single Family Home                                       Social Determinants of Health (SDOH) Interventions    Readmission Risk Interventions No flowsheet data found.

## 2019-04-26 NOTE — Care Management Important Message (Signed)
Important Message  Patient Details  Name: Alicia Richardson MRN: [id number] Date of Birth: Jul 22, 1935   Medicare Important Message Given:  Yes     [name] 04/26/2019, 2:10 PM

## 2019-04-27 DIAGNOSIS — I160 Hypertensive urgency: Secondary | ICD-10-CM

## 2019-04-27 DIAGNOSIS — N2889 Other specified disorders of kidney and ureter: Secondary | ICD-10-CM
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
## 2019-04-27 NOTE — Plan of Care (Signed)
  Problem: Health Behavior/Discharge Planning: Goal: Ability to manage health-related needs will improve Outcome: Progressing   Problem: Education: Goal: Knowledge of General Education information will improve Description: Including pain rating scale, medication(s)/side effects and non-pharmacologic comfort measures Outcome: Progressing   

## 2019-04-27 NOTE — TOC Progression Note (Addendum)
Transition of Care [facility]) - Progression Note    Patient Details  Name: Alicia Richardson MRN: [id number] Date of Birth: 04-14-1935  Transition of Care [facility]) CM/SW [place], [place] Phone Number: 04/27/2019, 10:50 AM  Clinical Narrative:   1:47pm- Pt authorization has been received, await covid screen results needed for SNF placement at discharge. Continue to follow.   10:50am- Pt niece has chosen [facility], they have started insurance approval. Pt will need new COVID within 3 days prior to discharge.   Continue to follow.    Expected Discharge Plan: Skilled Nursing Facility Barriers to Discharge: [place] (PASRR), Insurance Authorization, Continued Medical Work up  Expected Discharge Plan and Services Expected Discharge Plan: [place] In-house Referral: Clinical Social Work Post Acute Care Choice: [place] Living arrangements for the past 2 months: Single Family Home  Social Determinants of Health (SDOH) Interventions    Readmission Risk Interventions No flowsheet data found.

## 2019-04-28 LAB — NOVEL CORONAVIRUS, NAA (HOSP ORDER, SEND-OUT TO REF LAB; TAT 18-24 HRS): SARS-CoV-2, NAA: NOT DETECTED
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
## 2019-04-28 NOTE — TOC Progression Note (Addendum)
Transition of Care [facility]) - Progression Note    Patient Details  Name: Alicia Richardson MRN: [id number] Date of Birth: [birth year]  Transition of Care [facility]) CM/SW [place], [place] Phone Number: 04/28/2019, 9:49 AM  Clinical Narrative:    Await result of COVID screen, pt has bed and authorization for discharge to [facility]. Pt agreeable to SNF; understands we will arrange transport should she be stable for dc today. Pt niece contact and she's aware of care plan also.   Expected Discharge Plan: Skilled Nursing Facility Barriers to Discharge: [place] (PASRR), Insurance Authorization, Continued Medical Work up  Expected Discharge Plan and Services Expected Discharge Plan: [place] In-house Referral: Clinical Social Work   Post Acute Care Choice: [place] Living arrangements for the past 2 months: Single Family Home                   Social Determinants of Health (SDOH) Interventions    Readmission Risk Interventions No flowsheet data found.

## 2019-04-29 DIAGNOSIS — I471 Supraventricular tachycardia: Secondary | ICD-10-CM

## 2019-04-29 DIAGNOSIS — I161 Hypertensive emergency: Secondary | ICD-10-CM

## 2019-04-29 DIAGNOSIS — G9341 Metabolic encephalopathy: Secondary | ICD-10-CM

## 2019-04-29 DIAGNOSIS — N2889 Other specified disorders of kidney and ureter: Secondary | ICD-10-CM

## 2019-04-29 MED ORDER — CEFDINIR 300 MG PO CAPS: 300.0000 mg | ORAL_CAPSULE | Freq: Two times a day (BID) | ORAL | 0 refills | Status: AC

## 2019-04-29 MED ORDER — CARVEDILOL 6.25 MG PO TABS: 6.2500 mg | ORAL_TABLET | Freq: Two times a day (BID) | ORAL | 1 refills | Status: DC

## 2019-04-29 NOTE — Discharge Summary (Signed)
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 choledocholithiasis is seen. Pancreas: Mildly prominent pancreatic duct in the tail (series 11/image 48), better evaluated on recent MRI. Spleen: 2.0 cm cyst in the anterior spleen (series 11/image 40). Adrenals/Urinary Tract: Adrenal glands are within normal limits. 8.6 x 7.3 x 8.3 cm solid right lower pole renal mass with areas of peripheral enhancement, compatible with solid renal neoplasm such as renal cell carcinoma. Single right renal artery and vein. No renal vein invasion. Additional subcentimeter bilateral renal cysts. 2.3 cm nonobstructing right lower pole renal calculus. No hydronephrosis. Stomach/Bowel: Stomach is within normal limits. Visualized bowel is grossly unremarkable. Vascular/Lymphatic: No evidence of abdominal aortic aneurysm. Atherosclerotic calcifications  the abdominal aorta and branch vessels. No suspicious abdominal lymphadenopathy. Other: No abdominal ascites. Musculoskeletal: Degenerative changes of the lower thoracic spine. Mild superior endplate compression fracture deformity at L4, with approximately 15% loss of height (sagittal image 56). No retropulsion. IMPRESSION: 8.6 cm solid right lower pole renal mass, compatible with solid renal neoplasm such as  renal cell carcinoma. Single right renal artery and vein. No renal vein invasion. No regional lymphadenopathy or metastatic disease. 2.3 cm nonobstructing right lower pole renal calculus. No hydronephrosis. Mild superior endplate compression fracture deformity at L4, as described above. No retropulsion. Cholelithiasis, without associated inflammatory changes. Dilated common duct, measuring 15 mm. No choledocholithiasis is seen. Electronically Signed   By: Julian Hy M.D.   On: 04/26/2019 01:41     Time coordinating discharge: Over 30 minutes  SIGNED:   Guilford Shi, MD  [facility] 04/29/2019, 7:38 AM Pager : [phone]

## 2019-04-29 NOTE — Progress Notes (Signed)
Called report to [facility] s/w Linus Orn  provided report for patient being transferred.  Patient xferred with belongings to [facility] via ambulance by PTar. Pt VSS at discharge. Copy of  AVS sent with patient to facility. IV removed per orders.

## 2019-04-29 NOTE — TOC Transition Note (Signed)
Transition of Care [facility]) - CM/SW Discharge Note   Patient Details  Name: Alicia Richardson MRN: [id number] Date of Birth: 05/22/1935  Transition [facility]) CM/SW Contact:  [name], [place] Phone Number: 04/29/2019, 11:00 AM   Clinical Narrative:    Pt niece alerted of discharge, she is aware of pick up at noon.  PTAR called, scheduled for noon pick up.  PTAR papers on chart, no controlled substances noted on discharge summary.     Barriers to Discharge: [place] (PASRR), Insurance Authorization, Continued Medical Work up   Patient Goals and CMS Choice Patient states their goals for this hospitalization and ongoing recovery are:: "for her to get stronger before she goes home"- pt neice CMS Medicare.gov Compare Post Acute Care list provided to:: Patient Represenative (must comment) Choice offered to / list presented to : (pt niece)  Discharge Placement PASRR number recieved: 04/25/19            Patient [facility] bed at: [facility] Patient to be transferred to facility by: [place] Name of family member notified: pt niece [name] Patient and family notified of of transfer: 04/29/19  Discharge Plan and Services In-house Referral: Clinical Social Work   Post Acute Care Choice: [place]                               Social Determinants of Health (SDOH) Interventions     Readmission Risk Interventions No flowsheet data found.

## 2019-04-29 NOTE — Social Work (Signed)
Clinical Social Worker facilitated patient discharge including contacting patient family and facility to confirm patient discharge plans.  Clinical information faxed to facility and family agreeable with plan.  CSW arranged ambulance transport via [place] to [facility] RN to call [phone]  with report prior to discharge.  Clinical Social Worker will sign off for now as social work intervention is no longer needed. Please consult [place] again if new need arises.  Westley Hummer, MSW, [place] Social Worker [phone]

## 2019-05-17 ENCOUNTER — Other Ambulatory Visit: Payer: Self-pay | Admitting: Urology

## 2019-06-06 ENCOUNTER — Emergency Department (HOSPITAL_COMMUNITY): Payer: Medicare Other

## 2019-06-06 ENCOUNTER — Other Ambulatory Visit: Payer: Self-pay

## 2019-06-06 ENCOUNTER — Observation Stay (HOSPITAL_COMMUNITY): Payer: Medicare Other

## 2019-06-06 ENCOUNTER — Encounter (HOSPITAL_COMMUNITY): Payer: Self-pay | Admitting: Emergency Medicine

## 2019-06-06 ENCOUNTER — Observation Stay (HOSPITAL_COMMUNITY)
Admission: EM | Admit: 2019-06-06 | Discharge: 2019-06-07 | Disposition: A | Discharge status: Home / Self Care | Payer: Medicare Other | Attending: Internal Medicine | Admitting: Internal Medicine

## 2019-06-06 DIAGNOSIS — G9340 Encephalopathy, unspecified: Secondary | ICD-10-CM | POA: Insufficient documentation

## 2019-06-06 DIAGNOSIS — Z20828 Contact with and (suspected) exposure to other viral communicable diseases: Secondary | ICD-10-CM | POA: Insufficient documentation

## 2019-06-06 DIAGNOSIS — K8020 Calculus of gallbladder without cholecystitis without obstruction: Secondary | ICD-10-CM | POA: Diagnosis not present

## 2019-06-06 DIAGNOSIS — Z79899 Other long term (current) drug therapy: Secondary | ICD-10-CM | POA: Diagnosis not present

## 2019-06-06 DIAGNOSIS — N390 Urinary tract infection, site not specified: Secondary | ICD-10-CM | POA: Diagnosis present

## 2019-06-06 DIAGNOSIS — R9431 Abnormal electrocardiogram [ECG] [EKG]: Secondary | ICD-10-CM | POA: Insufficient documentation

## 2019-06-06 DIAGNOSIS — Z0184 Encounter for antibody response examination: Secondary | ICD-10-CM | POA: Insufficient documentation

## 2019-06-06 DIAGNOSIS — R112 Nausea with vomiting, unspecified: Secondary | ICD-10-CM

## 2019-06-06 DIAGNOSIS — I10 Essential (primary) hypertension: Secondary | ICD-10-CM | POA: Diagnosis not present

## 2019-06-06 DIAGNOSIS — Z880 Allergy status to penicillin: Secondary | ICD-10-CM | POA: Diagnosis not present

## 2019-06-06 DIAGNOSIS — Z8249 Family history of ischemic heart disease and other diseases of the circulatory system: Secondary | ICD-10-CM | POA: Diagnosis not present

## 2019-06-06 DIAGNOSIS — K8070 Calculus of gallbladder and bile duct without cholecystitis without obstruction: Secondary | ICD-10-CM

## 2019-06-06 DIAGNOSIS — I471 Supraventricular tachycardia: Secondary | ICD-10-CM | POA: Insufficient documentation

## 2019-06-06 DIAGNOSIS — E538 Deficiency of other specified B group vitamins: Secondary | ICD-10-CM | POA: Diagnosis not present

## 2019-06-06 DIAGNOSIS — K838 Other specified diseases of biliary tract: Secondary | ICD-10-CM | POA: Insufficient documentation

## 2019-06-06 DIAGNOSIS — N2889 Other specified disorders of kidney and ureter: Secondary | ICD-10-CM | POA: Insufficient documentation

## 2019-06-06 DIAGNOSIS — N289 Disorder of kidney and ureter, unspecified: Secondary | ICD-10-CM | POA: Diagnosis not present

## 2019-06-06 DIAGNOSIS — R2681 Unsteadiness on feet: Secondary | ICD-10-CM | POA: Insufficient documentation

## 2019-06-06 DIAGNOSIS — R319 Hematuria, unspecified: Secondary | ICD-10-CM

## 2019-06-06 DIAGNOSIS — R4182 Altered mental status, unspecified: Secondary | ICD-10-CM

## 2019-06-06 HISTORY — DX: Malignant (primary) neoplasm, unspecified: C80.1

## 2019-06-06 LAB — CBC
HCT: 47.5 % — ABNORMAL HIGH (ref 36.0–46.0)
Hemoglobin: 15.1 g/dL — ABNORMAL HIGH (ref 12.0–15.0)
MCH: 28.5 pg (ref 26.0–34.0)
MCHC: 31.8 g/dL (ref 30.0–36.0)
MCV: 89.8 fL (ref 80.0–100.0)
Platelets: 245 10*3/uL (ref 150–400)
RBC: 5.29 MIL/uL — ABNORMAL HIGH (ref 3.87–5.11)
RDW: 14.2 % (ref 11.5–15.5)
WBC: 14.2 10*3/uL — ABNORMAL HIGH (ref 4.0–10.5)
nRBC: 0 % (ref 0.0–0.2)

## 2019-06-06 LAB — COMPREHENSIVE METABOLIC PANEL
ALT: 15 U/L (ref 0–44)
AST: 21 U/L (ref 15–41)
Albumin: 4.3 g/dL (ref 3.5–5.0)
Alkaline Phosphatase: 108 U/L (ref 38–126)
Anion gap: 11 (ref 5–15)
BUN: 20 mg/dL (ref 8–23)
CO2: 27 mmol/L (ref 22–32)
Calcium: 9.4 mg/dL (ref 8.9–10.3)
Chloride: 107 mmol/L (ref 98–111)
Creatinine, Ser: 1.13 mg/dL — ABNORMAL HIGH (ref 0.44–1.00)
GFR calc Af Amer: 53 mL/min — ABNORMAL LOW (ref >60)
GFR calc non Af Amer: 46 mL/min — ABNORMAL LOW (ref >60)
Glucose, Bld: 161 mg/dL — ABNORMAL HIGH (ref 70–99)
Potassium: 3.8 mmol/L (ref 3.5–5.1)
Sodium: 145 mmol/L (ref 135–145)
Total Bilirubin: 0.9 mg/dL (ref 0.3–1.2)
Total Protein: 8.1 g/dL (ref 6.5–8.1)

## 2019-06-06 LAB — AMMONIA: Ammonia: 16 umol/L (ref 9–35)

## 2019-06-06 LAB — HIV ANTIBODY (ROUTINE TESTING W REFLEX): HIV Screen 4th Generation wRfx: NONREACTIVE

## 2019-06-06 LAB — TROPONIN I (HIGH SENSITIVITY)
Troponin I (High Sensitivity): 4 ng/L (ref <18)
Troponin I (High Sensitivity): 5 ng/L (ref <18)

## 2019-06-06 LAB — URINALYSIS, ROUTINE W REFLEX MICROSCOPIC
Bilirubin Urine: NEGATIVE
Glucose, UA: NEGATIVE mg/dL
Ketones, ur: NEGATIVE mg/dL
Nitrite: NEGATIVE
Protein, ur: 30 mg/dL — AB
RBC / HPF: >50 RBC/hpf — ABNORMAL HIGH (ref 0–5)
Specific Gravity, Urine: 1.016 (ref 1.005–1.030)
WBC, UA: >50 WBC/hpf — ABNORMAL HIGH (ref 0–5)
pH: 5 (ref 5.0–8.0)

## 2019-06-06 LAB — MAGNESIUM: Magnesium: 2 mg/dL (ref 1.7–2.4)

## 2019-06-06 LAB — SARS CORONAVIRUS 2 BY RT PCR (HOSPITAL ORDER, PERFORMED IN ~~LOC~~ HOSPITAL LAB): SARS Coronavirus 2: NEGATIVE

## 2019-06-06 LAB — VITAMIN B12: Vitamin B-12: 175 pg/mL — ABNORMAL LOW (ref 180–914)

## 2019-06-06 LAB — TSH: TSH: 1.096 u[IU]/mL (ref 0.350–4.500)

## 2019-06-06 LAB — RPR: RPR Ser Ql: NONREACTIVE

## 2019-06-06 MED ORDER — SODIUM CHLORIDE 0.9 % IV SOLN
INTRAVENOUS | Status: AC
  Administered 2019-06-06: 09:00:00 via INTRAVENOUS

## 2019-06-06 MED ORDER — SODIUM CHLORIDE 0.9 % IV SOLN
1.0000 g | INTRAVENOUS | Status: DC
  Administered 2019-06-07: 06:00:00 1 g via INTRAVENOUS
  Filled 2019-06-06: qty 1

## 2019-06-06 MED ORDER — LORAZEPAM 2 MG/ML IJ SOLN
0.5000 mg | Freq: Once | INTRAMUSCULAR | Status: AC
  Administered 2019-06-06: 05:00:00 0.5 mg via INTRAVENOUS
  Filled 2019-06-06: qty 1

## 2019-06-06 MED ORDER — SENNOSIDES-DOCUSATE SODIUM 8.6-50 MG PO TABS: 1.0000 | ORAL_TABLET | Freq: Every evening | ORAL | Status: DC | PRN

## 2019-06-06 MED ORDER — CARVEDILOL 6.25 MG PO TABS
6.2500 mg | ORAL_TABLET | Freq: Two times a day (BID) | ORAL | Status: DC
  Administered 2019-06-06 – 2019-06-07 (×4): 6.25 mg via ORAL
  Filled 2019-06-06 (×4): qty 1

## 2019-06-06 MED ORDER — SODIUM CHLORIDE 0.9% FLUSH
3.0000 mL | Freq: Once | INTRAVENOUS | Status: AC
  Administered 2019-06-06: 3 mL via INTRAVENOUS

## 2019-06-06 MED ORDER — ACETAMINOPHEN 325 MG PO TABS: 650.0000 mg | ORAL_TABLET | Freq: Four times a day (QID) | ORAL | Status: DC | PRN

## 2019-06-06 MED ORDER — SODIUM CHLORIDE 0.9 % IV SOLN
1.0000 g | Freq: Once | INTRAVENOUS | Status: AC
  Administered 2019-06-06: 06:00:00 1 g via INTRAVENOUS
  Filled 2019-06-06: qty 10

## 2019-06-06 MED ORDER — SODIUM CHLORIDE 0.9 % IV SOLN: INTRAVENOUS | Status: DC

## 2019-06-06 MED ORDER — SODIUM CHLORIDE 0.9 % IV BOLUS
500.0000 mL | Freq: Once | INTRAVENOUS | Status: AC
  Administered 2019-06-06: 06:00:00 500 mL via INTRAVENOUS

## 2019-06-06 MED ORDER — KETOROLAC TROMETHAMINE 30 MG/ML IJ SOLN
15.0000 mg | Freq: Once | INTRAMUSCULAR | Status: AC
  Administered 2019-06-06: 15 mg via INTRAVENOUS
  Filled 2019-06-06: qty 1

## 2019-06-06 MED ORDER — CIPROFLOXACIN IN D5W 400 MG/200ML IV SOLN: 400.0000 mg | Freq: Once | INTRAVENOUS | Status: DC

## 2019-06-06 MED ORDER — ACETAMINOPHEN 650 MG RE SUPP: 650.0000 mg | Freq: Four times a day (QID) | RECTAL | Status: DC | PRN

## 2019-06-06 MED ORDER — AMLODIPINE BESYLATE 10 MG PO TABS
10.0000 mg | ORAL_TABLET | Freq: Every day | ORAL | Status: DC
  Administered 2019-06-06: 10 mg via ORAL
  Filled 2019-06-06: qty 1

## 2019-06-06 MED ORDER — SODIUM CHLORIDE 0.9% FLUSH
3.0000 mL | Freq: Two times a day (BID) | INTRAVENOUS | Status: DC
  Administered 2019-06-06 – 2019-06-07 (×3): 3 mL via INTRAVENOUS

## 2019-06-06 MED ORDER — MAGNESIUM SULFATE IN D5W 1-5 GM/100ML-% IV SOLN
1.0000 g | Freq: Once | INTRAVENOUS | Status: AC
  Administered 2019-06-06: 07:00:00 1 g via INTRAVENOUS
  Filled 2019-06-06: qty 100

## 2019-06-06 MED ORDER — POTASSIUM CHLORIDE CRYS ER 20 MEQ PO TBCR
20.0000 meq | EXTENDED_RELEASE_TABLET | Freq: Once | ORAL | Status: AC
  Administered 2019-06-06: 07:00:00 20 meq via ORAL
  Filled 2019-06-06: qty 1

## 2019-06-06 MED ORDER — HEPARIN SODIUM (PORCINE) 5000 UNIT/ML IJ SOLN
5000.0000 [IU] | Freq: Three times a day (TID) | INTRAMUSCULAR | Status: DC
  Administered 2019-06-06 – 2019-06-07 (×5): 5000 [IU] via SUBCUTANEOUS
  Filled 2019-06-06 (×5): qty 1

## 2019-06-06 MED ORDER — VITAMIN B-12 1000 MCG PO TABS
1000.0000 ug | ORAL_TABLET | Freq: Every day | ORAL | Status: DC
  Administered 2019-06-06 – 2019-06-07 (×2): 1000 ug via ORAL
  Filled 2019-06-06 (×2): qty 1

## 2019-06-06 MED ORDER — HYDROCODONE-ACETAMINOPHEN 5-325 MG PO TABS: 1.0000 | ORAL_TABLET | Freq: Four times a day (QID) | ORAL | Status: DC | PRN
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
## 2019-06-06 NOTE — ED Notes (Signed)
Pt to CT

## 2019-06-06 NOTE — H&P (Signed)
History and Physical    Alicia Richardson [id number] DOB: 08-06-1935 DOA: 06/06/2019  PCP: [name], MD   Patient coming from: Home   Chief Complaint: Nausea, vomiting, malaise   HPI: Alicia Richardson is a 84 y.o. female with medical history significant for right renal mass, hypertension, and history of SVT status post ablation, now presenting to the emergency department for evaluation of nausea, vomiting, and malaise.  Patient initially reported several days of dizziness and vomiting, but has subsequently been denying any lightheadedness or dizziness, but reports that she came in to the ED due to nausea, nonbloody vomiting, and general malaise.  She specifically denies any abdominal pain, chest pain, or fevers.  She reports that she developed nausea within the past day, went on to have nonbloody vomiting, and has since been experiencing a general malaise and persistent nausea.  ED Course: Upon arrival to the ED, patient is found to be afebrile, saturating well on room air, and with remaining vitals also normal.  EKG features a sinus rhythm with PVC and QTc interval of 502 ms.  Chemistry panel is notable for creatinine 1.13, up from 0.87 six weeks earlier.  CBC is notable for leukocytosis to 14,200 and a slight polycythemia.  Urinalysis is compatible with possible infection.  Chest x-ray is negative for acute findings.  Head CT negative for acute intracranial abnormality.  CT abdomen pelvis notable for stable right renal mass concerning for renal cell carcinoma, cholelithiasis with 3 mm stone in the cystic duct, and subacute L4 compression fracture.  Patient was treated with Toradol, Ativan, 500 cc normal saline, and Rocephin in the emergency department, and hospitalists are asked to admit.  Review of Systems:  ROS limited by the patient's clinical condition.  Past Medical History:  Diagnosis Date   Cancer [facility])    Dysrhythmia 2009   SVT-ablation by Dr Lovena Le 09-24-08   Mass on back      Past Surgical History:  Procedure Laterality Date   ABLATION OF DYSRHYTHMIC FOCUS  09/24/2008   by Dr Lovena Le for SVT   APPENDECTOMY     MASS EXCISION Right 02/11/2018   Procedure: EXCISION RIGHT UPPER BACK  MASS;  Surgeon: Coralie Keens, MD;  Location: [place];  Service: General;  Laterality: Right;   TONSILLECTOMY       reports that she has never smoked. She has never used smokeless tobacco. She reports current alcohol use. She reports that she does not use drugs.  Allergies  Allergen Reactions   Penicillins Other (See Comments)    Pt states she is not allergic to penicillin Did it involve swelling of the face/tongue/throat, SOB, or low BP? Unknown Did it involve sudden or severe rash/hives, skin peeling, or any reaction on the inside of your mouth or nose? Unknown Did you need to seek medical attention at a hospital or doctor's office? Unknown When did it last happen?unknown If all above answers are NO, may proceed with cephalosporin use.    Family History  Problem Relation Age of Onset   Kidney disease Mother    Heart attack Brother      Prior to Admission medications   Medication Sig Start Date End Date Taking? Authorizing Provider  amLODipine (NORVASC) 10 MG tablet Take 1 tablet (10 mg total) by mouth at bedtime. 09/25/18 06/06/19 Yes Mariel Aloe, MD  carvedilol (COREG) 6.25 MG tablet Take 1 tablet (6.25 mg total) by mouth 2 (two) times daily with a meal. Patient not taking:  Reported on 06/06/2019 04/29/19   Guilford Shi, MD    Physical Exam: Vitals:   06/06/19 0213 06/06/19 0400 06/06/19 0500 06/06/19 0530  BP:  (!) 146/79 128/77 124/76  Pulse:  94 91 86  Resp:  16 17 18   Temp:      TempSrc:      SpO2:  96% 95% 94%  Weight: 69 kg     Height: 5\' 6"  (1.676 m)        Constitutional: NAD, calm  Eyes: PERTLA, lids and conjunctivae normal ENMT: Mucous membranes are moist. Posterior pharynx clear of any exudate or  lesions.   Neck: normal, supple, no masses, no thyromegaly Respiratory: No wheezing, no crackles. No accessory muscle use.  Cardiovascular: S1 & S2 heard, regular rate and rhythm. No extremity edema.  Abdomen: No distension, soft, suprapubic tenderness. Bowel sounds active.  Musculoskeletal: no clubbing / cyanosis. No joint deformity upper and lower extremities.   Skin: no significant rashes, lesions, ulcers. Warm, dry, well-perfused. Neurologic: CN 2-12 grossly intact. Sensation intact. Strength 5/5 in all 4 limbs.  Psychiatric: Alert. Intermittently confused. Pleasant, cooperative.    Labs on Admission: I have personally reviewed following labs and imaging studies  CBC: Recent Labs  Lab 06/06/19 0315  WBC 14.2*  HGB 15.1*  HCT 47.5*  MCV 89.8  PLT 99991111   Basic Metabolic Panel: Recent Labs  Lab 06/06/19 0315  NA 145  K 3.8  CL 107  CO2 27  GLUCOSE 161*  BUN 20  CREATININE 1.13*  CALCIUM 9.4   GFR: Estimated Creatinine Clearance: 36.6 mL/min (A) (by C-G formula based on SCr of 1.13 mg/dL (H)). Liver Function Tests: Recent Labs  Lab 06/06/19 0315  AST 21  ALT 15  ALKPHOS 108  BILITOT 0.9  PROT 8.1  ALBUMIN 4.3   No results for input(s): LIPASE, AMYLASE in the last 168 hours. No results for input(s): AMMONIA in the last 168 hours. Coagulation Profile: No results for input(s): INR, PROTIME in the last 168 hours. Cardiac Enzymes: No results for input(s): CKTOTAL, CKMB, CKMBINDEX, TROPONINI in the last 168 hours. BNP (last 3 results) No results for input(s): PROBNP in the last 8760 hours. HbA1C: No results for input(s): HGBA1C in the last 72 hours. CBG: No results for input(s): GLUCAP in the last 168 hours. Lipid Profile: No results for input(s): CHOL, HDL, LDLCALC, TRIG, CHOLHDL, LDLDIRECT in the last 72 hours. Thyroid Function Tests: No results for input(s): TSH, T4TOTAL, FREET4, T3FREE, THYROIDAB in the last 72 hours. Anemia Panel: No results for  input(s): VITAMINB12, FOLATE, FERRITIN, TIBC, IRON, RETICCTPCT in the last 72 hours. Urine analysis:    Component Value Date/Time   COLORURINE YELLOW 06/06/2019 0315   APPEARANCEUR CLOUDY (A) 06/06/2019 0315   LABSPEC 1.016 06/06/2019 0315   PHURINE 5.0 06/06/2019 0315   GLUCOSEU NEGATIVE 06/06/2019 0315   HGBUR MODERATE (A) 06/06/2019 0315   BILIRUBINUR NEGATIVE 06/06/2019 0315   KETONESUR NEGATIVE 06/06/2019 0315   PROTEINUR 30 (A) 06/06/2019 0315   UROBILINOGEN 0.2 08/15/2008 2143   NITRITE NEGATIVE 06/06/2019 0315   LEUKOCYTESUR LARGE (A) 06/06/2019 0315   Sepsis Labs: @LABRCNTIP (procalcitonin:4,lacticidven:4) )No results found for this or any previous visit (from the past 240 hour(s)).   Radiological Exams on Admission: Ct Head Wo Contrast  Result Date: 06/06/2019 CLINICAL DATA:  Dizziness, vomiting EXAM: CT HEAD WITHOUT CONTRAST TECHNIQUE: Contiguous axial images were obtained from the base of the skull through the vertex without intravenous contrast. COMPARISON:  08/15/2008 FINDINGS: Brain: There  is atrophy and chronic small vessel disease changes. No acute intracranial abnormality. Specifically, no hemorrhage, hydrocephalus, mass lesion, acute infarction, or significant intracranial injury. Vascular: No hyperdense vessel or unexpected calcification. Skull: No acute calvarial abnormality. Sinuses/Orbits: Visualized paranasal sinuses and mastoids clear. Orbital soft tissues unremarkable. Other: None IMPRESSION: Atrophy, chronic microvascular disease. No acute intracranial abnormality. Electronically Signed   By: Rolm Baptise M.D.   On: 06/06/2019 03:54   Dg Chest Portable 1 View  Result Date: 06/06/2019 CLINICAL DATA:  Vomiting EXAM: PORTABLE CHEST 1 VIEW COMPARISON:  04/23/2019 FINDINGS: Cardiomegaly. Tortuous ectatic aorta. Lungs clear. No effusions. No acute bony abnormality. IMPRESSION: Cardiomegaly.  No active disease. Electronically Signed   By: Rolm Baptise M.D.   On:  06/06/2019 02:52   Ct Renal Stone Study  Result Date: 06/06/2019 CLINICAL DATA:  Generalized weakness. Schedule for kidney surgery for a renal mass. EXAM: CT ABDOMEN AND PELVIS WITHOUT CONTRAST TECHNIQUE: Multidetector CT imaging of the abdomen and pelvis was performed following the standard protocol without IV contrast. COMPARISON:  04/25/2019 FINDINGS: Lower chest: No acute abnormality.  Right Bochdalek hernia. Hepatobiliary: No hepatic abnormality. No hepatic mass. Cholelithiasis with a 3 mm gallstone in the cystic duct. Common bile duct is dilated measuring up to 13 mm and tapers to a normal caliber at the pancreatic head. Pancreas: Unremarkable. No pancreatic ductal dilatation or surrounding inflammatory changes. Spleen: Stable 15 mm cyst in the spleen.  No other splenic mass. Adrenals/Urinary Tract: Normal adrenal glands. 8.5 x 7.5 x 8.5 cm solid right renal mass most concerning for renal cell carcinoma. Single right renal artery and vein. No other solid renal mass. No obstructive uropathy. Stable 2.2 cm right renal calculus. Decompressed bladder without focal abnormality. Stomach/Bowel: Stomach is within normal limits. No evidence of bowel wall thickening, distention, or inflammatory changes. Vascular/Lymphatic: Normal caliber abdominal aorta with mild atherosclerosis. No lymphadenopathy. Reproductive: Uterus and bilateral adnexa are unremarkable. Other: Small fat containing umbilical hernia.  No ascites. Musculoskeletal: Subacute L4 vertebral body compression fracture similar in appearance to the prior exam of 04/25/2019. No aggressive osseous lesion. Degenerative disc disease with disc height loss at L5-S1. bilateral facet arthropathy and foraminal narrowing at L5-S1. IMPRESSION: 1. Stable 8.5 x 7.5 x 8.5 cm solid right renal mass most concerning for renal cell carcinoma. 2. Cholelithiasis with a 3 mm gallstone in the cystic duct. If there is clinical concern regarding cholecystitis, recommend evaluation  with a right upper quadrant ultrasound. 3. Stable 2.2 cm right renal calculus. 4. Subacute L4 vertebral body compression fracture. Electronically Signed   By: Kathreen Devoid   On: 06/06/2019 04:59    EKG: Independently reviewed. Sinus rhythm, PVC, QTc 502 ms.   Assessment/Plan   1. Acute encephalopathy  - Presents with N/V and malaise, and is noted to have waxing and waning confusion in ED  - There is no focal neurologic deficit identified and head CT is negative for acute findings  - Likely secondary to UTI, will treat as below and also check TSH, RPR, B12, folate, and ammonia level while continuing supportive care   2. UTI  - Presents with N/V and malaise, noted to have waxing and waning confusion, has suprapubic tenderness, and UA is compatible with infection  - Urine culture was ordered and Rocephin started in ED  - Continue Rocephin while following cultures and clinical course    3. Renal mass  - Concerning for RCC and scheduled for resection later this month    4. Hypertension  -  BP at goal, continue Norvasc and Coreg    5. History of SVT  - Status-post ablation, continue beta-blocker   6. Prolonged QT interval  - QTc is prolonged to 502 ms in ED  - Replace potassium to 4 and mag to 2, continue cardiac monitoring, minimize QT-prolonging medications, and repeat EKG in am    7. Cholelithiasis  - Cholelithiasis noted on CT with stone in cystic duct  - RUQ is not particularly tender on admission and LFT's are normal but she presents with N/V, history is limited by confusion, and RUQ [place] will be checked     PPE: Mask, face shield  DVT prophylaxis: sq heparin  Code Status: Full  Family Communication: Discussed with patient  Consults called: None  Admission status: Observation     Vianne Bulls, MD [facility] Pager [phone]  If 7PM-7AM, please contact night-coverage [email or web address] Password [facility]  06/06/2019, 5:57 AM

## 2019-06-06 NOTE — ED Triage Notes (Signed)
Pt reports having dizziness and emesis for the last several days. Pt reports increasing weakness.

## 2019-06-06 NOTE — ED Notes (Signed)
ED TO INPATIENT HANDOFF REPORT  ED Nurse Name and Phone #:  Lanell Matar, RN  S Name/Age/Gender Sylvie Farrier 84 y.o. female Room/Bed: WA09/WA09  Code Status   Code Status: Prior  Home/SNF/Other Home Patient oriented to: self, place, time and situation Is this baseline? Yes   Triage Complete: Triage complete  Chief Complaint Dizziness  Triage Note Pt reports having dizziness and emesis for the last several days. Pt reports increasing weakness.    Allergies Allergies  Allergen Reactions  . Penicillins Other (See Comments)    Pt states she is not allergic to penicillin Did it involve swelling of the face/tongue/throat, SOB, or low BP? Unknown Did it involve sudden or severe rash/hives, skin peeling, or any reaction on the inside of your mouth or nose? Unknown Did you need to seek medical attention at a hospital or doctor's office? Unknown When did it last happen?unknown If all above answers are "NO", may proceed with cephalosporin use.    Level of Care/Admitting Diagnosis ED Disposition    ED Disposition Condition Comment   Admit  Hospital Area: [place] [100102]  Level of Care: Telemetry [5]  Admit to tele based on following criteria: Monitor QTC interval  Covid Evaluation: Asymptomatic Screening Protocol (No Symptoms)  Diagnosis: UTI (urinary tract infection) [id number]  Admitting Physician: Vianne Bulls [id number]  Attending Physician: Vianne Bulls [id number]  PT Class (Do Not Modify): Observation [104]  PT Acc Code (Do Not Modify): Observation [10022]       B Medical/Surgery History Past Medical History:  Diagnosis Date  . Cancer ([place])   . Dysrhythmia 2009   SVT-ablation by Dr Lovena Le 09-24-08  . Mass on back    Past Surgical History:  Procedure Laterality Date  . ABLATION OF DYSRHYTHMIC FOCUS  09/24/2008   by Dr Lovena Le for SVT  . APPENDECTOMY    . MASS EXCISION Right 02/11/2018   Procedure: EXCISION RIGHT UPPER BACK   MASS;  Surgeon: Coralie Keens, MD;  Location: [place];  Service: General;  Laterality: Right;  . TONSILLECTOMY       A IV Location/Drains/Wounds Patient Lines/Drains/Airways Status   Active Line/Drains/Airways    Name:   Placement date:   Placement time:   Site:   Days:   Peripheral IV 06/06/19 Left;Upper Arm   06/06/19    0310    Arm   less than 1   Wound / Incision (Open or Dehisced) 09/23/18 Laceration Head Posterior;Right   09/23/18    0200    Head   256          Intake/Output Last 24 hours No intake or output data in the 24 hours ending 06/06/19 0640  Labs/Imaging Results for orders placed or performed during the hospital encounter of 06/06/19 (from the past 48 hour(s))  Comprehensive metabolic panel     Status: Abnormal   Collection Time: 06/06/19  3:15 AM  Result Value Ref Range   Sodium 145 135 - 145 mmol/L   Potassium 3.8 3.5 - 5.1 mmol/L   Chloride 107 98 - 111 mmol/L   CO2 27 22 - 32 mmol/L   Glucose, Bld 161 (H) 70 - 99 mg/dL   BUN 20 8 - 23 mg/dL   Creatinine, Ser 1.13 (H) 0.44 - 1.00 mg/dL   Calcium 9.4 8.9 - 10.3 mg/dL   Total Protein 8.1 6.5 - 8.1 g/dL   Albumin 4.3 3.5 - 5.0 g/dL   AST 21 15 - 41  U/L   ALT 15 0 - 44 U/L   Alkaline Phosphatase 108 38 - 126 U/L   Total Bilirubin 0.9 0.3 - 1.2 mg/dL   GFR calc non Af Amer 46 (L) >60 mL/min   GFR calc Af Amer 53 (L) >60 mL/min   Anion gap 11 5 - 15    Comment: Performed at [facility], [place] [address], [place], [place] [address]  CBC     Status: Abnormal   Collection Time: 06/06/19  3:15 AM  Result Value Ref Range   WBC 14.2 (H) 4.0 - 10.5 K/uL   RBC 5.29 (H) 3.87 - 5.11 MIL/uL   Hemoglobin 15.1 (H) 12.0 - 15.0 g/dL   HCT 47.5 (H) 36.0 - 46.0 %   MCV 89.8 80.0 - 100.0 fL   MCH 28.5 26.0 - 34.0 pg   MCHC 31.8 30.0 - 36.0 g/dL   RDW 14.2 11.5 - 15.5 %   Platelets 245 150 - 400 K/uL   nRBC 0.0 0.0 - 0.2 %    Comment: Performed at [facility],  [place] [address], [place], [place] [address]  Urinalysis, Routine w reflex microscopic     Status: Abnormal   Collection Time: 06/06/19  3:15 AM  Result Value Ref Range   Color, Urine YELLOW YELLOW   APPearance CLOUDY (A) CLEAR   Specific Gravity, Urine 1.016 1.005 - 1.030   pH 5.0 5.0 - 8.0   Glucose, UA NEGATIVE NEGATIVE mg/dL   Hgb urine dipstick MODERATE (A) NEGATIVE   Bilirubin Urine NEGATIVE NEGATIVE   Ketones, ur NEGATIVE NEGATIVE mg/dL   Protein, ur 30 (A) NEGATIVE mg/dL   Nitrite NEGATIVE NEGATIVE   Leukocytes,Ua LARGE (A) NEGATIVE   RBC / HPF >50 (H) 0 - 5 RBC/hpf   WBC, UA >50 (H) 0 - 5 WBC/hpf   Bacteria, UA FEW (A) NONE SEEN   Squamous Epithelial / LPF 0-5 0 - 5   Mucus PRESENT    Hyaline Casts, UA PRESENT     Comment: Performed at [facility], [place] [address], [place], [place] [address]  Troponin I (High Sensitivity)     Status: None   Collection Time: 06/06/19  3:15 AM  Result Value Ref Range   Troponin I (High Sensitivity) 4 <18 ng/L    Comment: (NOTE) Elevated high sensitivity troponin I (hsTnI) values and significant  changes across serial measurements may suggest ACS but many other  chronic and acute conditions are known to elevate hsTnI results.  Refer to the "Links" section for chest pain algorithms and additional  guidance. Performed at [facility], [place] [address] [place], [place] [address]   Troponin I (High Sensitivity)     Status: None   Collection Time: 06/06/19  4:33 AM  Result Value Ref Range   Troponin I (High Sensitivity) 5 <18 ng/L    Comment: (NOTE) Elevated high sensitivity troponin I (hsTnI) values and significant  changes across serial measurements may suggest ACS but many other  chronic and acute conditions are known to elevate hsTnI results.  Refer to the "Links" section for chest pain algorithms and additional  guidance. Performed at [facility], [place] [address] [place], [place] [address]    Ct Head Wo Contrast  Result Date: 06/06/2019 CLINICAL DATA:  Dizziness, vomiting EXAM: CT HEAD WITHOUT CONTRAST TECHNIQUE: Contiguous axial images were obtained from the base of the skull through the vertex without intravenous contrast. COMPARISON:  08/15/2008  FINDINGS: Brain: There is atrophy and chronic small vessel disease changes. No acute intracranial abnormality. Specifically, no hemorrhage, hydrocephalus, mass lesion, acute infarction, or significant intracranial injury. Vascular: No hyperdense vessel or unexpected calcification. Skull: No acute calvarial abnormality. Sinuses/Orbits: Visualized paranasal sinuses and mastoids clear. Orbital soft tissues unremarkable. Other: None IMPRESSION: Atrophy, chronic microvascular disease. No acute intracranial abnormality. Electronically Signed   By: Rolm Baptise M.D.   On: 06/06/2019 03:54   Dg Chest Portable 1 View  Result Date: 06/06/2019 CLINICAL DATA:  Vomiting EXAM: PORTABLE CHEST 1 VIEW COMPARISON:  04/23/2019 FINDINGS: Cardiomegaly. Tortuous ectatic aorta. Lungs clear. No effusions. No acute bony abnormality. IMPRESSION: Cardiomegaly.  No active disease. Electronically Signed   By: Rolm Baptise M.D.   On: 06/06/2019 02:52   Ct Renal Stone Study  Result Date: 06/06/2019 CLINICAL DATA:  Generalized weakness. Schedule for kidney surgery for a renal mass. EXAM: CT ABDOMEN AND PELVIS WITHOUT CONTRAST TECHNIQUE: Multidetector CT imaging of the abdomen and pelvis was performed following the standard protocol without IV contrast. COMPARISON:  04/25/2019 FINDINGS: Lower chest: No acute abnormality.  Right Bochdalek hernia. Hepatobiliary: No hepatic abnormality. No hepatic mass. Cholelithiasis with a 3 mm gallstone in the cystic duct. Common bile duct is dilated measuring up to 13 mm and tapers to a normal caliber at the pancreatic head. Pancreas: Unremarkable. No pancreatic ductal dilatation or surrounding inflammatory changes.  Spleen: Stable 15 mm cyst in the spleen.  No other splenic mass. Adrenals/Urinary Tract: Normal adrenal glands. 8.5 x 7.5 x 8.5 cm solid right renal mass most concerning for renal cell carcinoma. Single right renal artery and vein. No other solid renal mass. No obstructive uropathy. Stable 2.2 cm right renal calculus. Decompressed bladder without focal abnormality. Stomach/Bowel: Stomach is within normal limits. No evidence of bowel wall thickening, distention, or inflammatory changes. Vascular/Lymphatic: Normal caliber abdominal aorta with mild atherosclerosis. No lymphadenopathy. Reproductive: Uterus and bilateral adnexa are unremarkable. Other: Small fat containing umbilical hernia.  No ascites. Musculoskeletal: Subacute L4 vertebral body compression fracture similar in appearance to the prior exam of 04/25/2019. No aggressive osseous lesion. Degenerative disc disease with disc height loss at L5-S1. bilateral facet arthropathy and foraminal narrowing at L5-S1. IMPRESSION: 1. Stable 8.5 x 7.5 x 8.5 cm solid right renal mass most concerning for renal cell carcinoma. 2. Cholelithiasis with a 3 mm gallstone in the cystic duct. If there is clinical concern regarding cholecystitis, recommend evaluation with a right upper quadrant ultrasound. 3. Stable 2.2 cm right renal calculus. 4. Subacute L4 vertebral body compression fracture. Electronically Signed   By: Kathreen Devoid   On: 06/06/2019 04:59    Pending Labs Unresulted Labs (From admission, onward)    Start     Ordered   06/06/19 0604  Magnesium  Once,   STAT     06/06/19 0604   06/06/19 0557  TSH  Once,   STAT     06/06/19 0556   06/06/19 0557  Ammonia  Once,   STAT     06/06/19 0556   06/06/19 0557  Vitamin B12  Once,   STAT     06/06/19 0556   06/06/19 0557  Folate RBC  Once,   STAT     06/06/19 0556   06/06/19 0557  RPR  Once,   STAT     06/06/19 0556   06/06/19 0557  HIV Antibody (routine testing w rflx)  Once,   STAT     06/06/19 0556    06/06/19 0515  Urine culture  ONCE - STAT,   STAT    Question:  Patient immune status  Answer:  Normal   06/06/19 0514   06/06/19 0500  SARS Coronavirus 2 [facility] order, Performed in [facility] hospital lab) Nasopharyngeal Nasopharyngeal Swab  (Symptomatic/High Risk of Exposure/Tier 1 Patients Labs with Precautions)  Once,   STAT    Question Answer Comment  Is this test for diagnosis or screening Diagnosis of ill patient   Symptomatic for COVID-19 as defined by CDC Yes   Date of Symptom Onset 06/06/2019   Hospitalized for COVID-19 Yes   Admitted to ICU for COVID-19 No   Previously tested for COVID-19 Yes   Resident in a congregate (group) care setting No   Employed in healthcare setting No   Pregnant No      06/06/19 0459   06/06/19 0413  Urinalysis, Routine w reflex microscopic  Once,   STAT     06/06/19 0412   Signed and Held  Basic metabolic panel  Tomorrow morning,   R     Signed and Held   Signed and Held  Magnesium  Tomorrow morning,   R     Signed and Held   Signed and Held  CBC WITH DIFFERENTIAL  Tomorrow morning,   R     Signed and Held          Vitals/Pain Today's Vitals   06/06/19 0500 06/06/19 0530 06/06/19 0600 06/06/19 0630  BP: 128/77 124/76 127/81 116/74  Pulse: 91 86 84 81  Resp: 17 18 13 16   Temp:      TempSrc:      SpO2: 95% 94% 96% 96%  Weight:      Height:      PainSc:        Isolation Precautions No active isolations  Medications Medications  0.9 %  sodium chloride infusion (has no administration in time range)  cefTRIAXone (ROCEPHIN) 1 g in sodium chloride 0.9 % 100 mL IVPB (has no administration in time range)  magnesium sulfate IVPB 1 g 100 mL (has no administration in time range)  sodium chloride flush (NS) 0.9 % injection 3 mL (3 mLs Intravenous Given 06/06/19 0316)  LORazepam (ATIVAN) injection 0.5 mg (0.5 mg Intravenous Given 06/06/19 0449)  ketorolac (TORADOL) 30 MG/ML injection 15 mg (15 mg Intravenous Given 06/06/19 0449)  sodium  chloride 0.9 % bolus 500 mL (500 mLs Intravenous New Bag/Given 06/06/19 0548)  cefTRIAXone (ROCEPHIN) 1 g in sodium chloride 0.9 % 100 mL IVPB (0 g Intravenous Stopped 06/06/19 0620)  potassium chloride SA (K-DUR) CR tablet 20 mEq (20 mEq Oral Given 06/06/19 0631)    Mobility walks Low fall risk   Focused Assessments    R Recommendations: See Admitting Provider Note  Report given to:   Additional Notes:

## 2019-06-06 NOTE — Progress Notes (Signed)
84 yo F presenting with AMS, UTI. See H&P for full details. Says nausea is improving. Continue fluids for right now and abx. Last couple UTIs have been e coli w/ similar resistance pattern. B12 low. Supplement. Remainder as per H&P  General: 84 y.o. female resting in bed in NAD Cardiovascular: RRR, +S1, S2, no m/g/r, equal pulses throughout Respiratory: CTABL, no w/r/r, normal WOB GI: BS+, NDNT, no masses noted, no organomegaly noted MSK: No e/c/c Neuro: Alert to name, follows commands Psyc: Appropriate interaction and affect, calm/cooperative  .Jonnie Finner, DO

## 2019-06-07 DIAGNOSIS — N390 Urinary tract infection, site not specified: Secondary | ICD-10-CM | POA: Diagnosis not present

## 2019-06-07 DIAGNOSIS — I471 Supraventricular tachycardia: Secondary | ICD-10-CM | POA: Diagnosis not present

## 2019-06-07 DIAGNOSIS — R319 Hematuria, unspecified: Secondary | ICD-10-CM | POA: Diagnosis not present

## 2019-06-07 DIAGNOSIS — I10 Essential (primary) hypertension: Secondary | ICD-10-CM | POA: Diagnosis not present

## 2019-06-07 DIAGNOSIS — E538 Deficiency of other specified B group vitamins: Secondary | ICD-10-CM | POA: Diagnosis not present

## 2019-06-07 DIAGNOSIS — Z20828 Contact with and (suspected) exposure to other viral communicable diseases: Secondary | ICD-10-CM | POA: Diagnosis not present

## 2019-06-07 LAB — URINE CULTURE
Culture: NO GROWTH
Special Requests: NORMAL

## 2019-06-07 LAB — CBC WITH DIFFERENTIAL/PLATELET
Abs Immature Granulocytes: 0.02 10*3/uL (ref 0.00–0.07)
Basophils Absolute: 0.1 10*3/uL (ref 0.0–0.1)
Basophils Relative: 1 %
Eosinophils Absolute: 0.3 10*3/uL (ref 0.0–0.5)
Eosinophils Relative: 3 %
HCT: 40.1 % (ref 36.0–46.0)
Hemoglobin: 12.4 g/dL (ref 12.0–15.0)
Immature Granulocytes: 0 %
Lymphocytes Relative: 36 %
Lymphs Abs: 3.6 10*3/uL (ref 0.7–4.0)
MCH: 28.2 pg (ref 26.0–34.0)
MCHC: 30.9 g/dL (ref 30.0–36.0)
MCV: 91.3 fL (ref 80.0–100.0)
Monocytes Absolute: 1 10*3/uL (ref 0.1–1.0)
Monocytes Relative: 10 %
Neutro Abs: 5 10*3/uL (ref 1.7–7.7)
Neutrophils Relative %: 50 %
Platelets: 193 10*3/uL (ref 150–400)
RBC: 4.39 MIL/uL (ref 3.87–5.11)
RDW: 14.3 % (ref 11.5–15.5)
WBC: 10 10*3/uL (ref 4.0–10.5)
nRBC: 0 % (ref 0.0–0.2)

## 2019-06-07 LAB — RENAL FUNCTION PANEL
Albumin: 3.2 g/dL — ABNORMAL LOW (ref 3.5–5.0)
Anion gap: 9 (ref 5–15)
BUN: 16 mg/dL (ref 8–23)
CO2: 22 mmol/L (ref 22–32)
Calcium: 8.5 mg/dL — ABNORMAL LOW (ref 8.9–10.3)
Chloride: 108 mmol/L (ref 98–111)
Creatinine, Ser: 0.8 mg/dL (ref 0.44–1.00)
GFR calc Af Amer: >60 mL/min (ref >60)
GFR calc non Af Amer: >60 mL/min (ref >60)
Glucose, Bld: 103 mg/dL — ABNORMAL HIGH (ref 70–99)
Phosphorus: 3.4 mg/dL (ref 2.5–4.6)
Potassium: 3.8 mmol/L (ref 3.5–5.1)
Sodium: 139 mmol/L (ref 135–145)

## 2019-06-07 LAB — MAGNESIUM: Magnesium: 2.1 mg/dL (ref 1.7–2.4)

## 2019-06-07 LAB — FOLATE RBC
Folate, Hemolysate: 355 ng/mL
Folate, RBC: 826 ng/mL (ref >498)
Hematocrit: 43 % (ref 34.0–46.6)

## 2019-06-07 MED ORDER — AMLODIPINE BESYLATE 10 MG PO TABS: 10.0000 mg | ORAL_TABLET | Freq: Every day | ORAL | 0 refills | Status: AC

## 2019-06-07 MED ORDER — CYANOCOBALAMIN 1000 MCG PO TABS: 1000.0000 ug | ORAL_TABLET | Freq: Every day | ORAL | 0 refills | Status: DC

## 2019-06-07 MED ORDER — CARVEDILOL 6.25 MG PO TABS: 6.2500 mg | ORAL_TABLET | Freq: Two times a day (BID) | ORAL | 0 refills | Status: DC

## 2019-06-07 MED ORDER — CYANOCOBALAMIN 1000 MCG/ML IJ SOLN
1000.0000 ug | Freq: Once | INTRAMUSCULAR | Status: AC
  Administered 2019-06-07: 1000 ug via SUBCUTANEOUS
  Filled 2019-06-07: qty 1

## 2019-06-07 NOTE — Plan of Care (Signed)
  Problem: Education: Goal: Knowledge of General Education information will improve Description: Including pain rating scale, medication(s)/side effects and non-pharmacologic comfort measures 06/07/2019 1836 by Annie Sable, RN Outcome: Completed/Met 06/07/2019 0814 by Annie Sable, RN Outcome: Progressing   Problem: Health Behavior/Discharge Planning: Goal: Ability to manage health-related needs will improve Outcome: Completed/Met   Problem: Clinical Measurements: Goal: Ability to maintain clinical measurements within normal limits will improve 06/07/2019 1836 by Annie Sable, RN Outcome: Completed/Met 06/07/2019 0814 by Annie Sable, RN Outcome: Progressing Goal: Will remain free from infection 06/07/2019 1836 by Annie Sable, RN Outcome: Completed/Met 06/07/2019 0814 by Annie Sable, RN Outcome: Progressing Goal: Diagnostic test results will improve 06/07/2019 1836 by Annie Sable, RN Outcome: Completed/Met 06/07/2019 0814 by Annie Sable, RN Outcome: Progressing Goal: Respiratory complications will improve Outcome: Completed/Met Goal: Cardiovascular complication will be avoided 06/07/2019 1836 by Annie Sable, RN Outcome: Completed/Met 06/07/2019 0814 by Annie Sable, RN Outcome: Progressing   Problem: Activity: Goal: Risk for activity intolerance will decrease 06/07/2019 1836 by Annie Sable, RN Outcome: Completed/Met 06/07/2019 0814 by Annie Sable, RN Outcome: Progressing   Problem: Nutrition: Goal: Adequate nutrition will be maintained 06/07/2019 1836 by Annie Sable, RN Outcome: Completed/Met 06/07/2019 0814 by Annie Sable, RN Outcome: Progressing   Problem: Coping: Goal: Level of anxiety will decrease Outcome: Completed/Met   Problem: Elimination: Goal: Will not experience complications related to bowel motility 06/07/2019 1836 by Annie Sable, RN Outcome: Completed/Met 06/07/2019 0814 by Annie Sable, RN Outcome:  Progressing Goal: Will not experience complications related to urinary retention Outcome: Completed/Met   Problem: Pain Managment: Goal: General experience of comfort will improve Outcome: Completed/Met   Problem: Safety: Goal: Ability to remain free from injury will improve 06/07/2019 1836 by Annie Sable, RN Outcome: Completed/Met 06/07/2019 0814 by Annie Sable, RN Outcome: Progressing   Problem: Skin Integrity: Goal: Risk for impaired skin integrity will decrease Outcome: Completed/Met

## 2019-06-07 NOTE — Progress Notes (Signed)
Pt discharged home today per Dr. Patel. Pt's IV site D/C'd and WDL. Pt's VSS. Pt provided with home medication list, discharge instructions and prescriptions. Verbalized understanding. Pt left floor via WC in stable condition accompanied by NT. 

## 2019-06-07 NOTE — Discharge Instructions (Signed)
Vitamin B12 Deficiency Vitamin B12 deficiency means that your body does not have enough vitamin B12. The body needs this vitamin:  To make red blood cells.  To make genes (DNA).  To help the nerves work. If you do not have enough vitamin B12 in your body, you can have health problems. What are the causes?  Not eating enough foods that contain vitamin B12.  Not being able to absorb vitamin B12 from the food that you eat.  Certain digestive system diseases.  A condition in which the body does not make enough of a certain protein, which results in too few red blood cells (pernicious anemia).  Having a surgery in which part of the stomach or small intestine is removed.  Taking medicines that make it hard for the body to absorb vitamin B12. These medicines include: ? Heartburn medicines. ? Some antibiotic medicines. ? Other medicines that are used to treat certain conditions. What increases the risk?  Being older than age 8.  Eating a vegetarian or vegan diet, especially while you are pregnant.  Eating a poor diet while you are pregnant.  Taking certain medicines.  Having alcoholism. What are the signs or symptoms? In some cases, there are no symptoms. If the condition leads to too few blood cells or nerve damage, symptoms can occur, such as:  Feeling weak.  Feeling tired (fatigued).  Not being hungry.  Weight loss.  A loss of feeling (numbness) or tingling in your hands and feet.  Redness and burning of the tongue.  Being mixed up (confused) or having memory problems.  Sadness (depression).  Problems with your senses. This can include color blindness, ringing in the ears, or loss of taste.  Watery poop (diarrhea) or trouble pooping (constipation).  Trouble walking. If anemia is very bad, symptoms can include:  Being short of breath.  Being dizzy.  Having a very fast heartbeat. How is this treated?  Changing the way you eat and drink, such  as: ? Eating more foods that contain vitamin B12. ? Drinking little or no alcohol.  Getting vitamin B12 shots.  Taking vitamin B12 supplements. Your doctor will tell you the dose that is best for you. Follow these instructions at home: Eating and drinking   Eat lots of healthy foods that contain vitamin B12. These include: ? Meats and poultry, such as beef, pork, chicken, [place], and organ meats, such as liver. ? Seafood, such as clams, rainbow trout, salmon, tuna, and haddock. ? Eggs. ? Cereal and dairy products that have vitamin B12 added to them. Check the label. The items listed above may not be a complete list of what you can eat and drink. Contact a dietitian for more options. General instructions  Get any shots as told by your doctor.  Take supplements only as told by your doctor.  Do not drink alcohol if your doctor tells you not to. In some cases, you may only be asked to limit alcohol use.  Keep all follow-up visits as told by your doctor. This is important. Contact a doctor if:  Your symptoms come back. Get help right away if:  You have trouble breathing.  You have a very fast heartbeat.  You have chest pain.  You get dizzy.  You pass out. Summary  Vitamin B12 deficiency means that your body is not getting enough vitamin B12.  In some cases, there are no symptoms of this condition.  Treatment may include making a change in the way you eat and drink,  getting vitamin B12 shots, or taking supplements.  Eat lots of healthy foods that contain vitamin B12. This information is not intended to replace advice given to you by your health care provider. Make sure you discuss any questions you have with your health care provider. Document Released: 09/03/2011 Document Revised: 05/24/2018 Document Reviewed: 05/24/2018 Elsevier Patient Education  2020 [employer].   Vitamin B12 oral What is this medicine? CYANOCOBALAMIN (sye an oh koe BAL a min) is a man made  form of vitamin B12. Vitamin B12 is essential in the development of healthy blood cells, nerve cells, and proteins in the body. It also helps with the metabolism of fats and carbohydrates. It is added to a healthy diet to prevent or treat low vitamin B-12 levels. This medicine may be used for other purposes; ask your health care provider or pharmacist if you have questions. What should I tell my health care provider before I take this medicine? They need to know if you have any of these conditions:  anemia  kidney disease  Leber's disease  malabsorption disorder  an unusual or allergic reaction to cyanocobalamin, cobalt, other medicines, foods, dyes, or preservatives  pregnant or trying to get pregnant  breast-feeding How should I use this medicine? Take this medicine by mouth with a glass of water. Follow the directions on the package or prescription label. If you are taking the tablets, do not chew, cut, or crush this medicine. If using an vitamin solution, use a specially marked spoon or dropper to measure each dose. Ask your pharmacist if you do not have one. Household spoons are not accurate. For best results take this vitamin with food. Take your medicine at regular intervals. Do not take your medicine more often than directed. Talk to your pediatrician regarding the use of this medicine in children. While this drug may be prescribed for selected conditions, precautions do apply. Overdosage: If you think you have taken too much of this medicine contact a poison control center or emergency room at once. NOTE: This medicine is only for you. Do not share this medicine with others. What if I miss a dose? If you miss a dose, take it as soon as you can. If it is almost time for your next dose, take only that dose. Do not take double or extra doses. What may interact with this medicine?  alcohol  aminosalicylic acid  colchicine  medicines that suppress your bone marrow like  chemotherapy, chloramphenicol This list may not describe all possible interactions. Give your health care provider a list of all the medicines, herbs, non-prescription drugs, or dietary supplements you use. Also tell them if you smoke, drink alcohol, or use illegal drugs. Some items may interact with your medicine. What should I watch for while using this medicine? Follow a healthy diet. Taking a vitamin supplement does not replace the need for a balanced diet. Some foods that have vitamin B-12 naturally are fish, seafood, egg yolk, milk and fermented cheese. Too much of this vitamin can be unsafe. Talk to your doctor or health care provider about how much is right for you. What side effects may I notice from receiving this medicine? Side effects that you should report to your doctor or health care professional as soon as possible:  allergic reactions like skin rash, itching or hives, swelling of the face, lips, or tongue  breathing problems  chest pain, tightness Side effects that usually do not require medical attention (report to your doctor or health care  professional if they continue or are bothersome):  diarrhea This list may not describe all possible side effects. Call your doctor for medical advice about side effects. You may report side effects to FDA at 1-800-FDA-1088. Where should I keep my medicine? Keep out of the reach of children. Store at room temperature between 15 and 30 degrees C (59 and 85 degrees F). Protect from heat and light. Throw away any unused medicine after the expiration date. NOTE: This sheet is a summary. It may not cover all possible information. If you have questions about this medicine, talk to your doctor, pharmacist, or health care provider.  2020 Elsevier/Gold Standard (2012-02-16 07:58:17)  Amlodipine tablets What is this medicine? AMLODIPINE (am LOE di peen) is a calcium-channel blocker. It affects the amount of calcium found in your heart and muscle  cells. This relaxes your blood vessels, which can reduce the amount of work the heart has to do. This medicine is used to lower high blood pressure. It is also used to prevent chest pain. This medicine may be used for other purposes; ask your health care provider or pharmacist if you have questions. COMMON BRAND NAME(S): Norvasc What should I tell my health care provider before I take this medicine? They need to know if you have any of these conditions:  heart disease  liver disease  an unusual or allergic reaction to amlodipine, other medicines, foods, dyes, or preservatives  pregnant or trying to get pregnant  breast-feeding How should I use this medicine? Take this medicine by mouth with a glass of water. Follow the directions on the prescription label. You can take it with or without food. If it upsets your stomach, take it with food. Take your medicine at regular intervals. Do not take it more often than directed. Do not stop taking except on your doctor's advice. Talk to your pediatrician regarding the use of this medicine in children. While this drug may be prescribed for children as young as 6 years for selected conditions, precautions do apply. Patients over 32 years of age may have a stronger reaction and need a smaller dose. Overdosage: If you think you have taken too much of this medicine contact a poison control center or emergency room at once. NOTE: This medicine is only for you. Do not share this medicine with others. What if I miss a dose? If you miss a dose, take it as soon as you can. If it is almost time for your next dose, take only that dose. Do not take double or extra doses. What may interact with this medicine? Do not take this medicine with any of the following medications:  tranylcypromine This medicine may also interact with the following medications:  clarithromycin  cyclosporine  diltiazem  itraconazole  simvastatin  tacrolimus This list may not  describe all possible interactions. Give your health care provider a list of all the medicines, herbs, non-prescription drugs, or dietary supplements you use. Also tell them if you smoke, drink alcohol, or use illegal drugs. Some items may interact with your medicine. What should I watch for while using this medicine? Visit your healthcare professional for regular checks on your progress. Check your blood pressure as directed. Ask your healthcare professional what your blood pressure should be and when you should contact him or her. Do not treat yourself for coughs, colds, or pain while you are using this medicine without asking your healthcare professional for advice. Some medicines may increase your blood pressure. You may get dizzy.  Do not drive, use machinery, or do anything that needs mental alertness until you know how this medicine affects you. Do not stand or sit up quickly, especially if you are an older patient. This reduces the risk of dizzy or fainting spells. Avoid alcoholic drinks; they can make you dizzier. What side effects may I notice from receiving this medicine? Side effects that you should report to your doctor or health care professional as soon as possible:  allergic reactions like skin rash, itching or hives; swelling of the face, lips, or tongue  fast, irregular heartbeat  signs and symptoms of low blood pressure like dizziness; feeling faint or lightheaded, falls; unusually weak or tired  swelling of ankles, feet, hands Side effects that usually do not require medical attention (report these to your doctor or health care professional if they continue or are bothersome):  dry mouth  facial flushing  headache  stomach pain  tiredness This list may not describe all possible side effects. Call your doctor for medical advice about side effects. You may report side effects to FDA at 1-800-FDA-1088. Where should I keep my medicine? Keep out of the reach of  children. Store at room temperature between 59 and 86 degrees F (15 and 30 degrees C). Throw away any unused medicine after the expiration date. NOTE: This sheet is a summary. It may not cover all possible information. If you have questions about this medicine, talk to your doctor, pharmacist, or health care provider.  2020 Elsevier/Gold Standard (2018-04-08 15:07:10)  Carvedilol tablets What is this medicine? CARVEDILOL (KAR ve dil ol) is a beta-blocker. Beta-blockers reduce the workload on the heart and help it to beat more regularly. This medicine is used to treat high blood pressure and heart failure. This medicine may be used for other purposes; ask your health care provider or pharmacist if you have questions. COMMON BRAND NAME(S): Coreg What should I tell my health care provider before I take this medicine? They need to know if you have any of these conditions:  circulation problems  diabetes  history of heart attack or heart disease  liver disease  lung or breathing disease, like asthma or emphysema  pheochromocytoma  slow or irregular heartbeat  thyroid disease  an unusual or allergic reaction to carvedilol, other beta-blockers, medicines, foods, dyes, or preservatives  pregnant or trying to get pregnant  breast-feeding How should I use this medicine? Take this medicine by mouth with a glass of water. Follow the directions on the prescription label. It is best to take the tablets with food. Take your doses at regular intervals. Do not take your medicine more often than directed. Do not stop taking except on the advice of your doctor or health care professional. Talk to your pediatrician regarding the use of this medicine in children. Special care may be needed. Overdosage: If you think you have taken too much of this medicine contact a poison control center or emergency room at once. NOTE: This medicine is only for you. Do not share this medicine with others. What if  I miss a dose? If you miss a dose, take it as soon as you can. If it is almost time for your next dose, take only that dose. Do not take double or extra doses. What may interact with this medicine? This medicine may interact with the following medications:  certain medicines for blood pressure, heart disease, irregular heart beat  certain medicines for depression, like fluoxetine or paroxetine  certain medicines for diabetes,  like glipizide or glyburide  cimetidine  clonidine  cyclosporine  digoxin  MAOIs like Carbex, Eldepryl, Marplan, Nardil, and Parnate  reserpine  rifampin This list may not describe all possible interactions. Give your health care provider a list of all the medicines, herbs, non-prescription drugs, or dietary supplements you use. Also tell them if you smoke, drink alcohol, or use illegal drugs. Some items may interact with your medicine. What should I watch for while using this medicine? Check your heart rate and blood pressure regularly while you are taking this medicine. Ask your doctor or health care professional what your heart rate and blood pressure should be, and when you should contact him or her. Do not stop taking this medicine suddenly. This could lead to serious heart-related effects. Contact your doctor or health care professional if you have difficulty breathing while taking this drug. Check your weight daily. Ask your doctor or health care professional when you should notify him/her of any weight gain. You may get drowsy or dizzy. Do not drive, use machinery, or do anything that requires mental alertness until you know how this medicine affects you. To reduce the risk of dizzy or fainting spells, do not sit or stand up quickly. Alcohol can make you more drowsy, and increase flushing and rapid heartbeats. Avoid alcoholic drinks. This medicine may increase blood sugar. Ask your healthcare provider if changes in diet or medicines are needed if you have  diabetes. If you are going to have surgery, tell your doctor or health care professional that you are taking this medicine. What side effects may I notice from receiving this medicine? Side effects that you should report to your doctor or health care professional as soon as possible:  allergic reactions like skin rash, itching or hives, swelling of the face, lips, or tongue  breathing problems  dark urine  irregular heartbeat   signs and symptoms of high blood sugar such as being more thirsty or hungry or having to urinate more than normal. You may also feel very tired or have blurry vision.  swollen legs or ankles  vomiting  yellowing of the eyes or skin Side effects that usually do not require medical attention (report to your doctor or health care professional if they continue or are bothersome):  change in sex drive or performance  diarrhea  dry eyes (especially if wearing contact lenses)  dry, itching skin  headache  nausea  unusually tired This list may not describe all possible side effects. Call your doctor for medical advice about side effects. You may report side effects to FDA at 1-800-FDA-1088. Where should I keep my medicine? Keep out of the reach of children. Store at room temperature below 30 degrees C (86 degrees F). Protect from moisture. Keep container tightly closed. Throw away any unused medicine after the expiration date. NOTE: This sheet is a summary. It may not cover all possible information. If you have questions about this medicine, talk to your doctor, pharmacist, or health care provider.  2020 Elsevier/Gold Standard (2018-07-06 09:13:57)

## 2019-06-07 NOTE — Plan of Care (Signed)
  Problem: Education: Goal: Knowledge of General Education information will improve Description: Including pain rating scale, medication(s)/side effects and non-pharmacologic comfort measures Outcome: Progressing   Problem: Clinical Measurements: Goal: Ability to maintain clinical measurements within normal limits will improve Outcome: Progressing Goal: Will remain free from infection Outcome: Progressing Goal: Diagnostic test results will improve Outcome: Progressing Goal: Respiratory complications will improve Outcome: Completed/Met Goal: Cardiovascular complication will be avoided Outcome: Progressing   Problem: Activity: Goal: Risk for activity intolerance will decrease Outcome: Progressing   Problem: Nutrition: Goal: Adequate nutrition will be maintained Outcome: Progressing   Problem: Coping: Goal: Level of anxiety will decrease Outcome: Completed/Met   Problem: Elimination: Goal: Will not experience complications related to bowel motility Outcome: Progressing Goal: Will not experience complications related to urinary retention Outcome: Completed/Met   Problem: Pain Managment: Goal: General experience of comfort will improve Outcome: Completed/Met   Problem: Safety: Goal: Ability to remain free from injury will improve Outcome: Progressing   Problem: Skin Integrity: Goal: Risk for impaired skin integrity will decrease Outcome: Completed/Met
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
## 2019-06-08 NOTE — Patient Instructions (Signed)
DUE TO COVID-19 ONLY ONE VISITOR IS ALLOWED TO COME WITH YOU AND STAY IN THE WAITING ROOM ONLY DURING PRE OP AND PROCEDURE DAY OF SURGERY. THE 1 VISITOR MAY VISIT WITH YOU AFTER SURGERY IN YOUR PRIVATE ROOM DURING VISITING HOURS ONLY!  YOU NEED TO HAVE A COVID 19 TEST ON___Saturday 09/12/2020____ @__1120  am_____, THIS TEST MUST BE DONE BEFORE SURGERY, COME  [address], [place] , [address].  ([place]) ONCE YOUR COVID TEST IS COMPLETED, PLEASE BEGIN THE QUARANTINE INSTRUCTIONS AS OUTLINED IN YOUR HANDOUT.                CLELA TAYS  06/08/2019   Your procedure is scheduled on: Wednesday 06/14/2019   Report to [facility] Main  Entrance              Report to Short Stay at   [place] AM     Call this number if you have problems the morning of surgery [phone]    Remember: Do not eat food or drink liquids :After Midnight.               BRUSH YOUR TEETH MORNING OF SURGERY AND RINSE YOUR MOUTH OUT, NO CHEWING GUM CANDY OR MINTS.     Take these medicines the morning of surgery with A SIP OF WATER: Carvedilol (Coreg)                                 You may not have any metal on your body including hair pins and              piercings  Do not wear jewelry, make-up, lotions, powders or perfumes, deodorant             Do not wear nail polish.  Do not shave  48 hours prior to surgery.               Do not bring valuables to the hospital. [place].  Contacts, dentures or bridgework may not be worn into surgery.  Leave suitcase in the car. After surgery it may be brought to your room.              Please read over the following fact sheets you were given: _____________________________________________________________________             [facility] - Preparing for Surgery Before surgery, you can play an important role.  Because skin is not sterile, your skin needs to be as free of germs as possible.   You can reduce the number of germs on your skin by washing with CHG (chlorahexidine gluconate) soap before surgery.  CHG is an antiseptic cleaner which kills germs and bonds with the skin to continue killing germs even after washing. Please DO NOT use if you have an allergy to CHG or antibacterial soaps.  If your skin becomes reddened/irritated stop using the CHG and inform your nurse when you arrive at Short Stay. Do not shave (including legs and underarms) for at least 48 hours prior to the first CHG shower.  You may shave your face/neck. Please follow these instructions carefully:  1.  Shower with CHG Soap the night before surgery and the  morning of Surgery.  2.  If you choose to wash your hair, wash your hair  first as usual with your  normal  shampoo.  3.  After you shampoo, rinse your hair and body thoroughly to remove the  shampoo.                           4.  Use CHG as you would any other liquid soap.  You can apply chg directly  to the skin and wash                       Gently with a scrungie or clean washcloth.  5.  Apply the CHG Soap to your body ONLY FROM THE NECK DOWN.   Do not use on face/ open                           Wound or open sores. Avoid contact with eyes, ears mouth and genitals (private parts).                       Wash face,  Genitals (private parts) with your normal soap.             6.  Wash thoroughly, paying special attention to the area where your surgery  will be performed.  7.  Thoroughly rinse your body with warm water from the neck down.  8.  DO NOT shower/wash with your normal soap after using and rinsing off  the CHG Soap.                9.  Pat yourself dry with a clean towel.            10.  Wear clean pajamas.            11.  Place clean sheets on your bed the night of your first shower and do not  sleep with pets. Day of Surgery : Do not apply any lotions/deodorants the morning of surgery.  Please wear clean clothes to the hospital/surgery  center.  FAILURE TO FOLLOW THESE INSTRUCTIONS MAY RESULT IN THE CANCELLATION OF YOUR SURGERY PATIENT SIGNATURE_________________________________  NURSE SIGNATURE__________________________________  ________________________________________________________________________  WHAT IS A BLOOD TRANSFUSION? Blood Transfusion Information  A transfusion is the replacement of blood or some of its parts. Blood is made up of multiple cells which provide different functions.  Red blood cells carry oxygen and are used for blood loss replacement.  White blood cells fight against infection.  Platelets control bleeding.  Plasma helps clot blood.  Other blood products are available for specialized needs, such as hemophilia or other clotting disorders. BEFORE THE TRANSFUSION  Who gives blood for transfusions?   Healthy volunteers who are fully evaluated to make sure their blood is safe. This is blood bank blood. Transfusion therapy is the safest it has ever been in the practice of medicine. Before blood is taken from a donor, a complete history is taken to make sure that person has no history of diseases nor engages in risky social behavior (examples are intravenous drug use or sexual activity with multiple partners). The donor's travel history is screened to minimize risk of transmitting infections, such as malaria. The donated blood is tested for signs of infectious diseases, such as HIV and hepatitis. The blood is then tested to be sure it is compatible with you in order to minimize the chance of a transfusion reaction. If you or a relative donates blood, this is often  done in anticipation of surgery and is not appropriate for emergency situations. It takes many days to process the donated blood. RISKS AND COMPLICATIONS Although transfusion therapy is very safe and saves many lives, the main dangers of transfusion include:   Getting an infectious disease.  Developing a transfusion reaction. This is an  allergic reaction to something in the blood you were given. Every precaution is taken to prevent this. The decision to have a blood transfusion has been considered carefully by your caregiver before blood is given. Blood is not given unless the benefits outweigh the risks. AFTER THE TRANSFUSION  Right after receiving a blood transfusion, you will usually feel much better and more energetic. This is especially true if your red blood cells have gotten low (anemic). The transfusion raises the level of the red blood cells which carry oxygen, and this usually causes an energy increase.  The nurse administering the transfusion will monitor you carefully for complications. HOME CARE INSTRUCTIONS  No special instructions are needed after a transfusion. You may find your energy is better. Speak with your caregiver about any limitations on activity for underlying diseases you may have. SEEK MEDICAL CARE IF:   Your condition is not improving after your transfusion.  You develop redness or irritation at the intravenous (IV) site. SEEK IMMEDIATE MEDICAL CARE IF:  Any of the following symptoms occur over the next 12 hours:  Shaking chills.  You have a temperature by mouth above 102 F (38.9 C), not controlled by medicine.  Chest, back, or muscle pain.  People around you feel you are not acting correctly or are confused.  Shortness of breath or difficulty breathing.  Dizziness and fainting.  You get a rash or develop hives.  You have a decrease in urine output.  Your urine turns a dark color or changes to pink, red, or brown. Any of the following symptoms occur over the next 10 days:  You have a temperature by mouth above 102 F (38.9 C), not controlled by medicine.  Shortness of breath.  Weakness after normal activity.  The white part of the eye turns yellow (jaundice).  You have a decrease in the amount of urine or are urinating less often.  Your urine turns a dark color or changes  to pink, red, or brown. Document Released: 09/11/2000 Document Revised: 12/07/2011 Document Reviewed: 04/30/2008 [facility] Patient Information 2014 [place], [place].  _______________________________________________________________________

## 2019-06-08 NOTE — Discharge Summary (Addendum)
[facility] Discharge Summary   Patient: Alicia Richardson [id number]   PCP: [name], MD DOB: 1934-11-27   Date of admission: 06/06/2019   Date of discharge: 06/07/2019     Discharge Diagnoses:  Principal diagnosis B12 deficiency   Principal Problem:   UTI (urinary tract infection) Active Problems:   Essential hypertension   SVT (supraventricular tachycardia) (HCC)   Acute encephalopathy   Renal mass, right   Mild renal insufficiency   Cholelithiases   Admitted From: home Disposition:  Home pt refused home health  Recommendations for Outpatient Follow-up:  1. PCP: please repeat B12, please encourage pt for home health, may need GI referral. 2. Follow up LABS/TEST:  Repeat b12, repeat LFT,  3. Mild ectasia of the main pancreatic duct in the tail the pancreas without apparent etiology. Repeat MRI in 3-6 months could be used to re-evaluate.  Follow-up Information    [name], MD. Schedule an appointment as soon as possible for a visit in 1 week(s).   Specialty: Family Medicine Contact information: [place] [id number]          Diet recommendation: Cardiac diet  Activity: The patient is advised to gradually reintroduce usual activities,as tolerated .  Discharge Condition: good  Code Status: Full code   History of present illness: As per the H and P dictated on admission, "Alicia Richardson is a 84 y.o. female with medical history significant for right renal mass, hypertension, and history of SVT status post ablation, now presenting to the emergency department for evaluation of nausea, vomiting, and malaise.  Patient initially reported several days of dizziness and vomiting, but has subsequently been denying any lightheadedness or dizziness, but reports that she came in to the ED due to nausea, nonbloody vomiting, and general malaise.  She specifically denies any abdominal pain, chest pain, or fevers.  She reports that she developed nausea  within the past day, went on to have nonbloody vomiting, and has since been experiencing a general malaise and persistent nausea.  ED Course: Upon arrival to the ED, patient is found to be afebrile, saturating well on room air, and with remaining vitals also normal.  EKG features a sinus rhythm with PVC and QTc interval of 502 ms.  Chemistry panel is notable for creatinine 1.13, up from 0.87 six weeks earlier.  CBC is notable for leukocytosis to 14,200 and a slight polycythemia.  Urinalysis is compatible with possible infection.  Chest x-ray is negative for acute findings.  Head CT negative for acute intracranial abnormality.  CT abdomen pelvis notable for stable right renal mass concerning for renal cell carcinoma, cholelithiasis with 3 mm stone in the cystic duct, and subacute L4 compression fracture.  Patient was treated with Toradol, Ativan, 500 cc normal saline, and Rocephin in the emergency department, and hospitalists are asked to admit."  Hospital Course:  Summary of her active problems in the hospital is as following. 1. Acute encephalopathy Severe b12 deficiency - Presents with N/V and malaise, and is noted to have waxing and waning confusion in ED  - There is no focal neurologic deficit identified and head CT is negative for acute findings  - no evidence of UTI, stop Antibiotics  - normal TSH, RPR, folate, and ammonia - Low B12, given b12 injection once and will replace orally.   2. Renal mass  - Concerning for RCC and scheduled for resection later this month    3. Hypertension  - BP at goal, continue Norvasc  and Coreg    4. History of SVT  - Status-post ablation, continue beta-blocker   5. Prolonged QT interval  - QTc is prolonged to 502 ms in ED  - Replace potassium to 4 and mag to 2  6. Cholelithiasis  Dilated CBD - Cholelithiasis noted on CT with stone in cystic duct  - No abdominal tenderness, fever or chills, nausea and vomiting resolved  - RUQ US shows CBD 13 mm  and cholelithiasis. Will need GI referral if continuous to have GI symptoms.  - MRI Abd in 03/2019 showed Common duct measures 16 mm diameter. tapers gradually into the ampulla. No evidence for obstructing mass lesion or choledocholithiasis.  Pancreas: Mild distention of the main pancreatic duct identified in the pancreatic tail. No obstructing mass lesion evident.  Patient was seen by physical therapy, who recommended Home health, pt refused home health. On the day of the discharge the patient's vitals were stable, and no other acute medical condition were reported by patient. the patient was felt safe to be discharge at Home with Therapy but pt/family refused.  Consultants: none Procedures: none  DISCHARGE MEDICATION: Allergies as of 06/07/2019      Reactions   Penicillins Other (See Comments)   Pt states she is not allergic to penicillin Did it involve swelling of the face/tongue/throat, SOB, or low BP? Unknown Did it involve sudden or severe rash/hives, skin peeling, or any reaction on the inside of your mouth or nose? Unknown Did you need to seek medical attention at a hospital or doctor's office? Unknown When did it last happen?unknown If all above answers are NO, may proceed with cephalosporin use.      Medication List    TAKE these medications   amLODipine 10 MG tablet Commonly known as: NORVASC Take 1 tablet (10 mg total) by mouth at bedtime.   carvedilol 6.25 MG tablet Commonly known as: COREG Take 1 tablet (6.25 mg total) by mouth 2 (two) times daily with a meal.   cyanocobalamin 1000 MCG tablet Take 1 tablet (1,000 mcg total) by mouth daily.      Allergies  Allergen Reactions   Penicillins Other (See Comments)    Pt states she is not allergic to penicillin Did it involve swelling of the face/tongue/throat, SOB, or low BP? Unknown Did it involve sudden or severe rash/hives, skin peeling, or any reaction on the inside of your mouth or nose? Unknown Did  you need to seek medical attention at a hospital or doctor's office? Unknown When did it last happen?unknown If all above answers are NO, may proceed with cephalosporin use.   Discharge Instructions    Diet - low sodium heart healthy   Complete by: As directed    Discharge instructions   Complete by: As directed    It is important that you read the given instructions as well as go over your medication list with RN to help you understand your care after this hospitalization.  Please follow-up with PCP in 1-2 weeks.  Please note that NO REFILLS for any discharge medications will be authorized once you are discharged, as it is imperative that you return to your primary care physician (or establish a relationship with a primary care physician if you do not have one) for your aftercare needs so that they can reassess your need for medications and monitor your lab values.  Please request your primary care physician to go over all Hospital Tests and Procedure/Radiological results at the follow up. Please get all  Hospital records sent to your PCP by signing hospital release before you go home.   Do not drive, operating heavy machinery, perform activities at heights, swimming or participation in water activities or provide baby sitting services; until you have been seen by Primary Care Physician or a Neurologist and are cleared to do such activities.  Do not take more than prescribed Pain, Sleep and Anxiety Medications.  You were cared for by a hospitalist during your hospital stay. If you have any questions about your discharge medications or the care you received while you were in the hospital after you are discharged, you can call the unit @UNIT @ you were admitted to and ask to speak with the hospitalist Berle Mull. Ask for Hospitalist on call if the hospitalist that took care of you is not available.   Once you are discharged, your primary care physician will handle any further medical  issues.  You Must read complete instructions/literature along with all the possible adverse reactions/side effects for all the Medicines you take and that have been prescribed to you. Take any new Medicines after you have completely understood and accept all the possible adverse reactions/side effects.  If you have smoked or chewed Tobacco in the last 2 yrs please STOP smoking STOP any Recreational drug use.  If you drink alcohol, please safely reduce the use. Do not drive, operating heavy machinery, perform activities at heights, swimming or participation in water activities or provide baby sitting services under influence.  Wear Seat belts while driving.   Increase activity slowly   Complete by: As directed      Discharge Exam: Filed Weights   06/06/19 0213 06/06/19 0810 06/07/19 0432  Weight: 69 kg 66.6 kg 66 kg   Vitals:   06/07/19 0432 06/07/19 1212  BP: (!) 154/87 (!) 144/73  Pulse: 78 69  Resp:  20  Temp: 98.7 F (37.1 C) 98.6 F (37 C)  SpO2: 98% 97%   General: Appear in no distress, no Rash; Oral Mucosa Clear, moist. no Abnormal Mass Or lumps Cardiovascular: S1 and S2 Present, no Murmur, Respiratory: normal respiratory effort, Bilateral Air entry present and Clear to Auscultation, no Crackles, no wheezes Abdomen: Bowel Sound present, Soft and no tenderness, no hernia Extremities: no Pedal edema, no calf tenderness Neurology: alert and oriented to time, place, and person affect appropriate.  The results of significant diagnostics from this hospitalization (including imaging, microbiology, ancillary and laboratory) are listed below for reference.    Significant Diagnostic Studies: Ct Head Wo Contrast  Result Date: 06/06/2019 CLINICAL DATA:  Dizziness, vomiting EXAM: CT HEAD WITHOUT CONTRAST TECHNIQUE: Contiguous axial images were obtained from the base of the skull through the vertex without intravenous contrast. COMPARISON:  08/15/2008 FINDINGS: Brain: There is atrophy  and chronic small vessel disease changes. No acute intracranial abnormality. Specifically, no hemorrhage, hydrocephalus, mass lesion, acute infarction, or significant intracranial injury. Vascular: No hyperdense vessel or unexpected calcification. Skull: No acute calvarial abnormality. Sinuses/Orbits: Visualized paranasal sinuses and mastoids clear. Orbital soft tissues unremarkable. Other: None IMPRESSION: Atrophy, chronic microvascular disease. No acute intracranial abnormality. Electronically Signed   By: Rolm Baptise M.D.   On: 06/06/2019 03:54   Dg Chest Portable 1 View  Result Date: 06/06/2019 CLINICAL DATA:  Vomiting EXAM: PORTABLE CHEST 1 VIEW COMPARISON:  04/23/2019 FINDINGS: Cardiomegaly. Tortuous ectatic aorta. Lungs clear. No effusions. No acute bony abnormality. IMPRESSION: Cardiomegaly.  No active disease. Electronically Signed   By: Rolm Baptise M.D.   On: 06/06/2019 02:52  Ct Renal Stone Study  Result Date: 06/06/2019 CLINICAL DATA:  Generalized weakness. Schedule for kidney surgery for a renal mass. EXAM: CT ABDOMEN AND PELVIS WITHOUT CONTRAST TECHNIQUE: Multidetector CT imaging of the abdomen and pelvis was performed following the standard protocol without IV contrast. COMPARISON:  04/25/2019 FINDINGS: Lower chest: No acute abnormality.  Right Bochdalek hernia. Hepatobiliary: No hepatic abnormality. No hepatic mass. Cholelithiasis with a 3 mm gallstone in the cystic duct. Common bile duct is dilated measuring up to 13 mm and tapers to a normal caliber at the pancreatic head. Pancreas: Unremarkable. No pancreatic ductal dilatation or surrounding inflammatory changes. Spleen: Stable 15 mm cyst in the spleen.  No other splenic mass. Adrenals/Urinary Tract: Normal adrenal glands. 8.5 x 7.5 x 8.5 cm solid right renal mass most concerning for renal cell carcinoma. Single right renal artery and vein. No other solid renal mass. No obstructive uropathy. Stable 2.2 cm right renal calculus.  Decompressed bladder without focal abnormality. Stomach/Bowel: Stomach is within normal limits. No evidence of bowel wall thickening, distention, or inflammatory changes. Vascular/Lymphatic: Normal caliber abdominal aorta with mild atherosclerosis. No lymphadenopathy. Reproductive: Uterus and bilateral adnexa are unremarkable. Other: Small fat containing umbilical hernia.  No ascites. Musculoskeletal: Subacute L4 vertebral body compression fracture similar in appearance to the prior exam of 04/25/2019. No aggressive osseous lesion. Degenerative disc disease with disc height loss at L5-S1. bilateral facet arthropathy and foraminal narrowing at L5-S1. IMPRESSION: 1. Stable 8.5 x 7.5 x 8.5 cm solid right renal mass most concerning for renal cell carcinoma. 2. Cholelithiasis with a 3 mm gallstone in the cystic duct. If there is clinical concern regarding cholecystitis, recommend evaluation with a right upper quadrant ultrasound. 3. Stable 2.2 cm right renal calculus. 4. Subacute L4 vertebral body compression fracture. Electronically Signed   By: Kathreen Devoid   On: 06/06/2019 04:59   US Abdomen Limited Ruq  Result Date: 06/06/2019 CLINICAL DATA:  Nausea, vomiting. EXAM: ULTRASOUND ABDOMEN LIMITED RIGHT UPPER QUADRANT COMPARISON:  CT scan of same day. FINDINGS: Gallbladder: Cholelithiasis is noted with minimal gallbladder wall thickening noted measuring approximately 3 mm. No pericholecystic fluid or sonographic Murphy's sign is noted. Common bile duct: Diameter: 13 mm which is abnormally dilated and concerning for distal common bile duct obstruction. Liver: No focal lesion identified. Within normal limits in parenchymal echogenicity. Portal vein is patent on color Doppler imaging with normal direction of blood flow towards the liver. Other: 8.7 cm solid mass is seen involving the right kidney concerning for renal cell carcinoma, as noted on prior CT scan. IMPRESSION: Cholelithiasis is noted with minimal gallbladder  wall thickening. Common bile duct is significantly dilated concerning for distal common bile duct obstruction; correlation with liver function tests and MRCP is recommended. 8.7 cm solid right renal mass is noted concerning for renal cell carcinoma, as noted on prior CT scan. Electronically Signed   By: Marijo Conception M.D.   On: 06/06/2019 07:26    Microbiology: Recent Results (from the past 240 hour(s))  Urine culture     Status: None   Collection Time: 06/06/19  3:15 AM   Specimen: Urine, Clean Catch  Result Value Ref Range Status   Specimen Description   Final    URINE, CLEAN CATCH Performed at [facility], [place] [address], [place], [place] [address]    Special Requests   Final    Normal Performed at [facility], [place] [address], [place], [place] [address]    Culture  Final    NO GROWTH Performed at [facility], [place] [address] [place], [place] [address]    Report Status 06/07/2019 FINAL  Final  SARS Coronavirus 2 [facility] order, Performed in [facility] hospital lab) Nasopharyngeal Nasopharyngeal Swab     Status: None   Collection Time: 06/06/19  5:56 AM   Specimen: Nasopharyngeal Swab  Result Value Ref Range Status   SARS Coronavirus 2 NEGATIVE NEGATIVE Final    Comment: (NOTE) If result is NEGATIVE SARS-CoV-2 target nucleic acids are NOT DETECTED. The SARS-CoV-2 RNA is generally detectable in upper and lower  respiratory specimens during the acute phase of infection. The lowest  concentration of SARS-CoV-2 viral copies this assay can detect is 250  copies / mL. A negative result does not preclude SARS-CoV-2 infection  and should not be used as the sole basis for treatment or other  patient management decisions.  A negative result may occur with  improper specimen collection / handling, submission of specimen other  than nasopharyngeal swab, presence of viral mutation(s) within the  areas targeted by this assay, and inadequate  number of viral copies  (<250 copies / mL). A negative result must be combined with clinical  observations, patient history, and epidemiological information. If result is POSITIVE SARS-CoV-2 target nucleic acids are DETECTED. The SARS-CoV-2 RNA is generally detectable in upper and lower  respiratory specimens dur ing the acute phase of infection.  Positive  results are indicative of active infection with SARS-CoV-2.  Clinical  correlation with patient history and other diagnostic information is  necessary to determine patient infection status.  Positive results do  not rule out bacterial infection or co-infection with other viruses. If result is PRESUMPTIVE POSTIVE SARS-CoV-2 nucleic acids MAY BE PRESENT.   A presumptive positive result was obtained on the submitted specimen  and confirmed on repeat testing.  While 2019 novel coronavirus  (SARS-CoV-2) nucleic acids may be present in the submitted sample  additional confirmatory testing may be necessary for epidemiological  and / or clinical management purposes  to differentiate between  SARS-CoV-2 and other Sarbecovirus currently known to infect humans.  If clinically indicated additional testing with an alternate test  methodology [phone]) is advised. The SARS-CoV-2 RNA is generally  detectable in upper and lower respiratory sp ecimens during the acute  phase of infection. The expected result is Negative. Fact Sheet for Patients:  StrictlyIdeas.no Fact Sheet for Healthcare Providers: BankingDealers.co.za This test is not yet approved or cleared by the [place] FDA and has been authorized for detection and/or diagnosis of SARS-CoV-2 by FDA under an Emergency Use Authorization (EUA).  This EUA will remain in effect (meaning this test can be used) for the duration of the COVID-19 declaration under Section 564(b)(1) of the Act, 21 U.S.C. section 360bbb-3(b)(1), unless the  authorization is terminated or revoked sooner. Performed at [facility], [place] [address], [place], [place] [address]      Labs: CBC: Recent Labs  Lab 06/06/19 0315 06/06/19 0604 06/07/19 0340  WBC 14.2*  --  10.0  NEUTROABS  --   --  5.0  HGB 15.1*  --  12.4  HCT 47.5* 43.0 40.1  MCV 89.8  --  91.3  PLT 245  --  0000000   Basic Metabolic Panel: Recent Labs  Lab 06/06/19 0315 06/06/19 0604 06/07/19 0340  NA 145  --  139  K 3.8  --  3.8  CL 107  --  108  CO2 27  --  22  GLUCOSE 161*  --  103*  BUN 20  --  16  CREATININE 1.13*  --  0.80  CALCIUM 9.4  --  8.5*  MG  --  2.0 2.1  PHOS  --   --  3.4   Liver Function Tests: Recent Labs  Lab 06/06/19 0315 06/07/19 0340  AST 21  --   ALT 15  --   ALKPHOS 108  --   BILITOT 0.9  --   PROT 8.1  --   ALBUMIN 4.3 3.2*   No results for input(s): LIPASE, AMYLASE in the last 168 hours. Recent Labs  Lab 06/06/19 0604  AMMONIA 16   Cardiac Enzymes: No results for input(s): CKTOTAL, CKMB, CKMBINDEX, TROPONINI in the last 168 hours. BNP (last 3 results) No results for input(s): BNP in the last 8760 hours. CBG: No results for input(s): GLUCAP in the last 168 hours.  Time spent: 35 minutes  Signed:  Berle Mull  [facility] 06/07/2019  7:51 AM

## 2019-06-09 ENCOUNTER — Encounter (HOSPITAL_COMMUNITY): Payer: Self-pay

## 2019-06-09 ENCOUNTER — Encounter (INDEPENDENT_AMBULATORY_CARE_PROVIDER_SITE_OTHER): Payer: Self-pay

## 2019-06-09 ENCOUNTER — Encounter (HOSPITAL_COMMUNITY)
Admission: RE | Admit: 2019-06-09 | Discharge: 2019-06-09 | Disposition: A | Discharge status: Home / Self Care | Payer: Medicare Other | Source: Ambulatory Visit | Attending: Urology | Admitting: Urology

## 2019-06-09 ENCOUNTER — Other Ambulatory Visit: Payer: Self-pay

## 2019-06-09 DIAGNOSIS — N2889 Other specified disorders of kidney and ureter: Secondary | ICD-10-CM | POA: Insufficient documentation

## 2019-06-09 DIAGNOSIS — Z0184 Encounter for antibody response examination: Secondary | ICD-10-CM | POA: Insufficient documentation

## 2019-06-09 DIAGNOSIS — Z01812 Encounter for preprocedural laboratory examination: Secondary | ICD-10-CM | POA: Insufficient documentation

## 2019-06-09 HISTORY — DX: Essential (primary) hypertension: I10

## 2019-06-09 HISTORY — DX: Other specified disorders of kidney and ureter: N28.89

## 2019-06-09 LAB — BASIC METABOLIC PANEL
Anion gap: 12 (ref 5–15)
BUN: 15 mg/dL (ref 8–23)
CO2: 25 mmol/L (ref 22–32)
Calcium: 9.2 mg/dL (ref 8.9–10.3)
Chloride: 107 mmol/L (ref 98–111)
Creatinine, Ser: 0.93 mg/dL (ref 0.44–1.00)
GFR calc Af Amer: >60 mL/min (ref >60)
GFR calc non Af Amer: 58 mL/min — ABNORMAL LOW (ref >60)
Glucose, Bld: 135 mg/dL — ABNORMAL HIGH (ref 70–99)
Potassium: 4.7 mmol/L (ref 3.5–5.1)
Sodium: 144 mmol/L (ref 135–145)

## 2019-06-09 LAB — CBC
HCT: 44 % (ref 36.0–46.0)
Hemoglobin: 13.3 g/dL (ref 12.0–15.0)
MCH: 27.9 pg (ref 26.0–34.0)
MCHC: 30.2 g/dL (ref 30.0–36.0)
MCV: 92.2 fL (ref 80.0–100.0)
Platelets: 216 10*3/uL (ref 150–400)
RBC: 4.77 MIL/uL (ref 3.87–5.11)
RDW: 14.2 % (ref 11.5–15.5)
WBC: 7.2 10*3/uL (ref 4.0–10.5)
nRBC: 0 % (ref 0.0–0.2)

## 2019-06-09 LAB — PROTIME-INR
INR: 1 (ref 0.8–1.2)
Prothrombin Time: 13.2 seconds (ref 11.4–15.2)

## 2019-06-09 LAB — ABO/RH: ABO/RH(D): O POS

## 2019-06-09 NOTE — Progress Notes (Signed)
PCP - dr Yaakov Guthrie Cardiologist - none currently. Ablation dr Lovena Le 09-24-2008  Chest x-ray - none EKG - 06-07-19 epic Stress Test - none ECHO - none Cardiac Cath - none  Sleep Study - none CPAP - none  Fasting Blood Sugar -n/a  Checks Blood Sugar _____ times a day  Blood Thinner Instructions:none Aspirin Instructions:none Last Dose:  Anesthesia review:   Patient denies shortness of breath, fever, cough and chest pain at PAT appointment   Patient verbalized understanding of instructions that were given to them at the PAT appointment. Patient was also instructed that they will need to review over the PAT instructions again at home before surgery.

## 2019-06-10 ENCOUNTER — Other Ambulatory Visit (HOSPITAL_COMMUNITY)
Admission: RE | Admit: 2019-06-10 | Discharge: 2019-06-10 | Disposition: A | Discharge status: Home / Self Care | Payer: Medicare Other | Source: Ambulatory Visit | Attending: Urology | Admitting: Urology

## 2019-06-10 DIAGNOSIS — Z20828 Contact with and (suspected) exposure to other viral communicable diseases: Secondary | ICD-10-CM | POA: Diagnosis not present

## 2019-06-10 DIAGNOSIS — Z01812 Encounter for preprocedural laboratory examination: Secondary | ICD-10-CM | POA: Diagnosis present

## 2019-06-11 LAB — NOVEL CORONAVIRUS, NAA (HOSP ORDER, SEND-OUT TO REF LAB; TAT 18-24 HRS): SARS-CoV-2, NAA: NOT DETECTED

## 2019-06-14 ENCOUNTER — Inpatient Hospital Stay (HOSPITAL_COMMUNITY): Payer: Medicare Other | Admitting: Anesthesiology

## 2019-06-14 ENCOUNTER — Inpatient Hospital Stay (HOSPITAL_COMMUNITY): Payer: Medicare Other | Admitting: Physician Assistant

## 2019-06-14 ENCOUNTER — Inpatient Hospital Stay (HOSPITAL_COMMUNITY)
Admission: RE | Admit: 2019-06-14 | Discharge: 2019-06-16 | DRG: 658 | Disposition: A | Discharge status: Home / Self Care | Payer: Medicare Other | Source: Other Acute Inpatient Hospital | Attending: Urology | Admitting: Urology

## 2019-06-14 ENCOUNTER — Other Ambulatory Visit: Payer: Self-pay

## 2019-06-14 ENCOUNTER — Encounter (HOSPITAL_COMMUNITY)
Admission: RE | Disposition: A | Discharge status: Home / Self Care | Payer: Self-pay | Source: Other Acute Inpatient Hospital | Attending: Urology

## 2019-06-14 ENCOUNTER — Encounter (HOSPITAL_COMMUNITY): Payer: Self-pay | Admitting: *Deleted

## 2019-06-14 DIAGNOSIS — Z8249 Family history of ischemic heart disease and other diseases of the circulatory system: Secondary | ICD-10-CM | POA: Diagnosis not present

## 2019-06-14 DIAGNOSIS — Z823 Family history of stroke: Secondary | ICD-10-CM | POA: Diagnosis not present

## 2019-06-14 DIAGNOSIS — Z801 Family history of malignant neoplasm of trachea, bronchus and lung: Secondary | ICD-10-CM

## 2019-06-14 DIAGNOSIS — I10 Essential (primary) hypertension: Secondary | ICD-10-CM | POA: Diagnosis present

## 2019-06-14 DIAGNOSIS — Z20828 Contact with and (suspected) exposure to other viral communicable diseases: Secondary | ICD-10-CM | POA: Diagnosis present

## 2019-06-14 DIAGNOSIS — N2889 Other specified disorders of kidney and ureter: Secondary | ICD-10-CM | POA: Diagnosis present

## 2019-06-14 DIAGNOSIS — Z833 Family history of diabetes mellitus: Secondary | ICD-10-CM

## 2019-06-14 DIAGNOSIS — C649 Malignant neoplasm of unspecified kidney, except renal pelvis: Principal | ICD-10-CM | POA: Diagnosis present

## 2019-06-14 DIAGNOSIS — Z79899 Other long term (current) drug therapy: Secondary | ICD-10-CM | POA: Diagnosis not present

## 2019-06-14 HISTORY — PX: LAPAROSCOPIC NEPHRECTOMY, HAND ASSISTED: SHX1929

## 2019-06-14 LAB — TYPE AND SCREEN
ABO/RH(D): O POS
Antibody Screen: NEGATIVE

## 2019-06-14 LAB — BASIC METABOLIC PANEL
Anion gap: 10 (ref 5–15)
BUN: 20 mg/dL (ref 8–23)
CO2: 23 mmol/L (ref 22–32)
Calcium: 8.6 mg/dL — ABNORMAL LOW (ref 8.9–10.3)
Chloride: 107 mmol/L (ref 98–111)
Creatinine, Ser: 0.77 mg/dL (ref 0.44–1.00)
GFR calc Af Amer: >60 mL/min (ref >60)
GFR calc non Af Amer: >60 mL/min (ref >60)
Glucose, Bld: 121 mg/dL — ABNORMAL HIGH (ref 70–99)
Potassium: 3.6 mmol/L (ref 3.5–5.1)
Sodium: 140 mmol/L (ref 135–145)

## 2019-06-14 LAB — HEMOGLOBIN AND HEMATOCRIT, BLOOD
HCT: 39.5 % (ref 36.0–46.0)
Hemoglobin: 12.5 g/dL (ref 12.0–15.0)

## 2019-06-14 SURGERY — NEPHRECTOMY, HAND-ASSISTED, LAPAROSCOPIC
Anesthesia: General | Laterality: Right

## 2019-06-14 MED ORDER — ONDANSETRON HCL 4 MG/2ML IJ SOLN
INTRAMUSCULAR | Status: AC
  Filled 2019-06-14: qty 2

## 2019-06-14 MED ORDER — AMLODIPINE BESYLATE 10 MG PO TABS
10.0000 mg | ORAL_TABLET | Freq: Every day | ORAL | Status: DC
  Administered 2019-06-14 – 2019-06-15 (×2): 10 mg via ORAL
  Filled 2019-06-14 (×2): qty 1

## 2019-06-14 MED ORDER — FENTANYL CITRATE (PF) 250 MCG/5ML IJ SOLN
INTRAMUSCULAR | Status: AC
  Filled 2019-06-14: qty 5

## 2019-06-14 MED ORDER — DEXAMETHASONE SODIUM PHOSPHATE 10 MG/ML IJ SOLN
INTRAMUSCULAR | Status: AC
  Filled 2019-06-14: qty 1

## 2019-06-14 MED ORDER — CLINDAMYCIN PHOSPHATE 900 MG/50ML IV SOLN
900.0000 mg | Freq: Once | INTRAVENOUS | Status: AC
  Administered 2019-06-14: 09:00:00 900 mg via INTRAVENOUS
  Filled 2019-06-14: qty 50

## 2019-06-14 MED ORDER — 0.9 % SODIUM CHLORIDE (POUR BTL) OPTIME
TOPICAL | Status: DC | PRN
  Administered 2019-06-14: 1000 mL

## 2019-06-14 MED ORDER — HYDROMORPHONE HCL 1 MG/ML IJ SOLN
0.5000 mg | INTRAMUSCULAR | Status: DC | PRN
  Administered 2019-06-14 – 2019-06-16 (×4): 0.5 mg via INTRAVENOUS
  Filled 2019-06-14 (×4): qty 0.5

## 2019-06-14 MED ORDER — SODIUM CHLORIDE 0.9% FLUSH
INTRAVENOUS | Status: DC | PRN
  Administered 2019-06-14: 20 mL

## 2019-06-14 MED ORDER — LACTATED RINGERS IR SOLN
Status: DC | PRN
  Administered 2019-06-14: 1000 mL

## 2019-06-14 MED ORDER — SODIUM CHLORIDE (PF) 0.9 % IJ SOLN
INTRAMUSCULAR | Status: AC
  Filled 2019-06-14: qty 20

## 2019-06-14 MED ORDER — EPHEDRINE 5 MG/ML INJ
INTRAVENOUS | Status: AC
  Filled 2019-06-14: qty 10

## 2019-06-14 MED ORDER — PROMETHAZINE HCL 25 MG/ML IJ SOLN: 6.2500 mg | INTRAMUSCULAR | Status: DC | PRN

## 2019-06-14 MED ORDER — BUPIVACAINE LIPOSOME 1.3 % IJ SUSP
20.0000 mL | Freq: Once | INTRAMUSCULAR | Status: AC
  Administered 2019-06-14: 20 mL
  Filled 2019-06-14: qty 20

## 2019-06-14 MED ORDER — EPHEDRINE SULFATE-NACL 50-0.9 MG/10ML-% IV SOSY
PREFILLED_SYRINGE | INTRAVENOUS | Status: DC | PRN
  Administered 2019-06-14 (×2): 5 mg via INTRAVENOUS
  Administered 2019-06-14: 10 mg via INTRAVENOUS
  Administered 2019-06-14 (×2): 5 mg via INTRAVENOUS

## 2019-06-14 MED ORDER — PROPOFOL 10 MG/ML IV BOLUS
INTRAVENOUS | Status: DC | PRN
  Administered 2019-06-14: 100 mg via INTRAVENOUS

## 2019-06-14 MED ORDER — LIDOCAINE 2% (20 MG/ML) 5 ML SYRINGE
INTRAMUSCULAR | Status: AC
  Filled 2019-06-14: qty 5

## 2019-06-14 MED ORDER — LACTATED RINGERS IV SOLN
INTRAVENOUS | Status: DC
  Administered 2019-06-14: 07:00:00 via INTRAVENOUS

## 2019-06-14 MED ORDER — PHENYLEPHRINE HCL (PRESSORS) 10 MG/ML IV SOLN
INTRAVENOUS | Status: AC
  Filled 2019-06-14: qty 1

## 2019-06-14 MED ORDER — SENNA 8.6 MG PO TABS
1.0000 | ORAL_TABLET | Freq: Two times a day (BID) | ORAL | Status: DC
  Administered 2019-06-14 – 2019-06-16 (×4): 8.6 mg via ORAL
  Filled 2019-06-14 (×4): qty 1

## 2019-06-14 MED ORDER — SODIUM CHLORIDE 0.9 % IV SOLN
INTRAVENOUS | Status: DC
  Administered 2019-06-14: 16:00:00 via INTRAVENOUS
  Administered 2019-06-14: 11:00:00 1000 mL via INTRAVENOUS
  Administered 2019-06-14 – 2019-06-16 (×5): via INTRAVENOUS

## 2019-06-14 MED ORDER — OXYCODONE HCL 5 MG PO TABS
5.0000 mg | ORAL_TABLET | ORAL | Status: DC | PRN
  Administered 2019-06-15 (×2): 5 mg via ORAL
  Filled 2019-06-14 (×2): qty 1

## 2019-06-14 MED ORDER — DOCUSATE SODIUM 100 MG PO CAPS
100.0000 mg | ORAL_CAPSULE | Freq: Two times a day (BID) | ORAL | Status: DC
  Administered 2019-06-14 – 2019-06-16 (×4): 100 mg via ORAL
  Filled 2019-06-14 (×4): qty 1

## 2019-06-14 MED ORDER — HYDROMORPHONE HCL 1 MG/ML IJ SOLN
INTRAMUSCULAR | Status: AC
  Filled 2019-06-14: qty 1

## 2019-06-14 MED ORDER — DEXAMETHASONE SODIUM PHOSPHATE 10 MG/ML IJ SOLN
INTRAMUSCULAR | Status: DC | PRN
  Administered 2019-06-14: 6 mg via INTRAVENOUS

## 2019-06-14 MED ORDER — ROCURONIUM BROMIDE 10 MG/ML (PF) SYRINGE
PREFILLED_SYRINGE | INTRAVENOUS | Status: DC | PRN
  Administered 2019-06-14: 60 mg via INTRAVENOUS

## 2019-06-14 MED ORDER — ACETAMINOPHEN 325 MG PO TABS
650.0000 mg | ORAL_TABLET | ORAL | Status: DC | PRN
  Administered 2019-06-16: 650 mg via ORAL
  Filled 2019-06-14: qty 2

## 2019-06-14 MED ORDER — ONDANSETRON HCL 4 MG/2ML IJ SOLN
INTRAMUSCULAR | Status: DC | PRN
  Administered 2019-06-14: 4 mg via INTRAVENOUS

## 2019-06-14 MED ORDER — CARVEDILOL 6.25 MG PO TABS
6.2500 mg | ORAL_TABLET | Freq: Two times a day (BID) | ORAL | Status: DC
  Administered 2019-06-14 – 2019-06-16 (×4): 6.25 mg via ORAL
  Filled 2019-06-14 (×4): qty 1

## 2019-06-14 MED ORDER — FENTANYL CITRATE (PF) 250 MCG/5ML IJ SOLN
INTRAMUSCULAR | Status: DC | PRN
  Administered 2019-06-14 (×5): 50 ug via INTRAVENOUS

## 2019-06-14 MED ORDER — PROPOFOL 10 MG/ML IV BOLUS
INTRAVENOUS | Status: AC
  Filled 2019-06-14: qty 20

## 2019-06-14 MED ORDER — SUGAMMADEX SODIUM 200 MG/2ML IV SOLN
INTRAVENOUS | Status: DC | PRN
  Administered 2019-06-14: 150 mg via INTRAVENOUS

## 2019-06-14 MED ORDER — HYDROMORPHONE HCL 1 MG/ML IJ SOLN
0.2500 mg | INTRAMUSCULAR | Status: DC | PRN
  Administered 2019-06-14 (×3): 0.5 mg via INTRAVENOUS

## 2019-06-14 MED ORDER — ROCURONIUM BROMIDE 10 MG/ML (PF) SYRINGE
PREFILLED_SYRINGE | INTRAVENOUS | Status: AC
  Filled 2019-06-14: qty 10

## 2019-06-14 SURGICAL SUPPLY — 65 items
ADH SKN CLS APL DERMABOND .7 (GAUZE/BANDAGES/DRESSINGS)
AGENT HMST KT MTR STRL THRMB (HEMOSTASIS)
APL PRP STRL LF DISP 70% ISPRP (MISCELLANEOUS) ×1
BAG LAPAROSCOPIC 12 15 PORT 16 (BASKET) IMPLANT
BAG RETRIEVAL 12/15 (BASKET)
BAG SPEC RTRVL LRG 6X4 10 (ENDOMECHANICALS)
BAG SPEC THK2 15X12 ZIP CLS (MISCELLANEOUS) ×1
BAG ZIPLOCK 12X15 (MISCELLANEOUS) ×2 IMPLANT
BLADE EXTENDED COATED 6.5IN (ELECTRODE) IMPLANT
BLADE SURG SZ10 CARB STEEL (BLADE) IMPLANT
CABLE HIGH FREQUENCY MONO STRZ (ELECTRODE) ×2 IMPLANT
CHLORAPREP W/TINT 26 (MISCELLANEOUS) ×2 IMPLANT
CLEANER TIP ELECTROSURG 2X2 (MISCELLANEOUS) IMPLANT
CLIP VESOLOCK LG 6/CT PURPLE (CLIP) ×2 IMPLANT
CLIP VESOLOCK MED LG 6/CT (CLIP) IMPLANT
CLIP VESOLOCK XL 6/CT (CLIP) IMPLANT
COVER SURGICAL LIGHT HANDLE (MISCELLANEOUS) ×2 IMPLANT
COVER WAND RF STERILE (DRAPES) IMPLANT
CUTTER FLEX LINEAR 45M (STAPLE) ×1 IMPLANT
DERMABOND ADVANCED (GAUZE/BANDAGES/DRESSINGS)
DERMABOND ADVANCED .7 DNX12 (GAUZE/BANDAGES/DRESSINGS) IMPLANT
DRAIN CHANNEL 10F 3/8 F FF (DRAIN) IMPLANT
DRAPE INCISE IOBAN 66X45 STRL (DRAPES) ×2 IMPLANT
DRSG TEGADERM 2-3/8X2-3/4 SM (GAUZE/BANDAGES/DRESSINGS) ×3 IMPLANT
DRSG TEGADERM 4X4.75 (GAUZE/BANDAGES/DRESSINGS) IMPLANT
DRSG TELFA 4X8 ISLAND (GAUZE/BANDAGES/DRESSINGS) ×1 IMPLANT
ELECT PENCIL ROCKER SW 15FT (MISCELLANEOUS) ×2 IMPLANT
ELECT REM PT RETURN 15FT ADLT (MISCELLANEOUS) ×2 IMPLANT
EVACUATOR SILICONE 100CC (DRAIN) IMPLANT
GAUZE SPONGE 2X2 8PLY STRL LF (GAUZE/BANDAGES/DRESSINGS) IMPLANT
GLOVE BIO SURGEON STRL SZ 6.5 (GLOVE) ×2 IMPLANT
GLOVE BIO SURGEON STRL SZ7.5 (GLOVE) ×2 IMPLANT
GOWN STRL REUS W/TWL LRG LVL3 (GOWN DISPOSABLE) ×4 IMPLANT
GOWN STRL REUS W/TWL XL LVL3 (GOWN DISPOSABLE) ×2 IMPLANT
HEMOSTAT SURGICEL 4X8 (HEMOSTASIS) ×1 IMPLANT
IRRIG SUCT STRYKERFLOW 2 WTIP (MISCELLANEOUS)
IRRIGATION SUCT STRKRFLW 2 WTP (MISCELLANEOUS) IMPLANT
KIT BASIN OR (CUSTOM PROCEDURE TRAY) ×2 IMPLANT
KIT TURNOVER KIT A (KITS) IMPLANT
LIGASURE VESSEL 5MM BLUNT TIP (ELECTROSURGICAL) ×1 IMPLANT
MARKER SKIN DUAL TIP RULER LAB (MISCELLANEOUS) ×1 IMPLANT
POUCH SPECIMEN RETRIEVAL 10MM (ENDOMECHANICALS) IMPLANT
RELOAD 45 VASCULAR/THIN (ENDOMECHANICALS) ×2 IMPLANT
RELOAD STAPLE 45 2.5 WHT GRN (ENDOMECHANICALS) IMPLANT
SCISSORS LAP 5X35 DISP (ENDOMECHANICALS) IMPLANT
SET TUBE SMOKE EVAC HIGH FLOW (TUBING) ×2 IMPLANT
SPONGE GAUZE 2X2 STER 10/PKG (GAUZE/BANDAGES/DRESSINGS) ×1
SPONGE LAP 18X18 RF (DISPOSABLE) ×1 IMPLANT
STAPLER VISISTAT 35W (STAPLE) ×1 IMPLANT
SURGIFLO W/THROMBIN 8M KIT (HEMOSTASIS) IMPLANT
SUT ETHILON 3 0 PS 1 (SUTURE) IMPLANT
SUT MNCRL AB 4-0 PS2 18 (SUTURE) IMPLANT
SUT PDS AB 1 TP1 96 (SUTURE) ×4 IMPLANT
SUT VIC AB 2-0 SH 27 (SUTURE)
SUT VIC AB 2-0 SH 27X BRD (SUTURE) IMPLANT
SUT VICRYL 0 UR6 27IN ABS (SUTURE) IMPLANT
SYS LAPSCP GELPORT 120MM (MISCELLANEOUS) ×2
SYSTEM LAPSCP GELPORT 120MM (MISCELLANEOUS) ×1 IMPLANT
TOWEL OR 17X26 10 PK STRL BLUE (TOWEL DISPOSABLE) ×3 IMPLANT
TRAY FOLEY MTR SLVR 16FR STAT (SET/KITS/TRAYS/PACK) ×2 IMPLANT
TRAY LAPAROSCOPIC (CUSTOM PROCEDURE TRAY) ×2 IMPLANT
TROCAR BLADELESS OPT 5 100 (ENDOMECHANICALS) ×1 IMPLANT
TROCAR UNIVERSAL OPT 12M 100M (ENDOMECHANICALS) ×2 IMPLANT
TROCAR XCEL 12X100 BLDLESS (ENDOMECHANICALS) ×2 IMPLANT
YANKAUER SUCT BULB TIP 10FT TU (MISCELLANEOUS) ×2 IMPLANT

## 2019-06-14 NOTE — Anesthesia Postprocedure Evaluation (Signed)
Anesthesia Post Note  Patient: RAY GIOVANNONI  Procedure(s) Performed: HAND ASSISTED LAPAROSCOPIC RADICAL NEPHRECTOMY (Right )     Patient location during evaluation: PACU Anesthesia Type: General Level of consciousness: awake and alert Pain management: pain level controlled Vital Signs Assessment: post-procedure vital signs reviewed and stable Respiratory status: spontaneous breathing, nonlabored ventilation, respiratory function stable and patient connected to nasal cannula oxygen Cardiovascular status: blood pressure returned to baseline and stable Postop Assessment: no apparent nausea or vomiting Anesthetic complications: no    Last Vitals:  Vitals:   06/14/19 1130 06/14/19 1145  BP: (!) 160/85 (!) 151/75  Pulse: 68 72  Resp: 11 18  Temp:  36.4 C  SpO2: 100% 96%    Last Pain:  Vitals:   06/14/19 1145  TempSrc:   PainSc: 0-No pain                 Tallis Soledad S

## 2019-06-14 NOTE — Op Note (Signed)
Operative Note  Preoperative diagnosis:  1.  Right renal mass  Postoperative diagnosis: 1.  Right renal mass  Procedure(s): 1.  Right hand assisted laparoscopic radical nephrectomy  Surgeon: Link Snuffer, MD  Assistants: Kerrie Pleasure, MD, resident an assistant was needed throughout the case further expertise in assisting with a laparoscopic surgery, including visualization with the camera, passing instruments, etc.  Anesthesia: General  Complications: None  EBL: 100 cc  Specimens: 1.  Right kidney  Drains/Catheters: 1. Foley catheter  Intraoperative findings: Right kidney removed entirely  Indication: 84 year old female who was found on imaging to have a right renal mass concerning for renal cell carcinoma.  After discussion of different options, the patient elected to undergo the above operation.  Description of procedure:  The patient was identified and consent was obtained.  The patient was taken to the operating room and placed in the supine position.  The patient was placed under general anesthesia.  Perioperative antibiotics were administered.  The patient was placed in right lateral position at approximately 65 degrees and all pressure points were padded.  Patient was prepped and draped in a standard sterile fashion and a timeout was performed.  An 8 cm periumbilical incision was made sharply into the skin.  This was carried down with Bovie electrocautery down to the anterior rectus sheath which was divided with electrocautery.  The underlying musculature was separated in the midline.  Sharp dissection with Metzenbaum scissors was used to open up the posterior sheath and peritoneum.  This was extended with electrocautery taking great care not to use cautery near the bowel.  The hand assist port was secured into the incision.  I made sure no bowel was trapped within this.  A 12 mm port was inserted through the hand assist port and the abdomen was insufflated to a pressure  of 15.  A 12 mm port was placed lateral as well as superior to the hand assist port, each 1 about a hand width away. A 5 mm port was placed superior to the midline port and used for liver retraction.  Please note that all ports were placed under direct visualization with the camera.  The colon was first dissected medially by incising along the white line of Toldt.  After medializing the colon, the kidney was dissected laterally and medially as well as superiorly.  Inferior attachments as well as the ureter and gonadal vein were divided with LigaSure device. I continued to carefully dissect medially and identified the renal hilum.  The renal vein and renal artery were divided with a 45 mm vascular staple load.  Superior attachments were then released using LigaSure device as well as blunt dissection.  Once the entire kidney and surrounding Gerota's fascia was freed, the specimen was withdrawn from the midline incision and passed off for permanent specimen.  The abdomen was reinspected and no active bleeding was noted.  Surgicel was applied to the nephrectomy bed superiorly under the liver.  The midline fascia was closed with running 0 looped PDS suture followed by staples.  The port incisions were closed with a 2-0 Vicryl followed by staples.  Exparel was instilled for anesthetic effect.  A dressing was applied.  Patient tolerated the procedure well and was stable postoperatively.  Plan: Stat labs will be obtained.  Anticipate the patient will be in the hospital 1-2 nights as long as she does well.

## 2019-06-14 NOTE — Discharge Instructions (Signed)
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
## 2019-06-14 NOTE — Anesthesia Preprocedure Evaluation (Signed)
Anesthesia Evaluation  Patient identified by MRN, date of birth, ID band Patient awake    Reviewed: Allergy & Precautions, NPO status , Patient's Chart, lab work & pertinent test results  Airway Mallampati: II  TM Distance: >3 FB Neck ROM: Full    Dental no notable dental hx.    Pulmonary neg pulmonary ROS,    Pulmonary exam normal breath sounds clear to auscultation       Cardiovascular hypertension, Pt. on medications Normal cardiovascular exam Rhythm:Regular Rate:Normal     Neuro/Psych negative neurological ROS  negative psych ROS   GI/Hepatic negative GI ROS, Neg liver ROS,   Endo/Other  negative endocrine ROS  Renal/GU negative Renal ROS  negative genitourinary   Musculoskeletal negative musculoskeletal ROS (+)   Abdominal   Peds negative pediatric ROS (+)  Hematology negative hematology ROS (+)   Anesthesia Other Findings   Reproductive/Obstetrics negative OB ROS                             Anesthesia Physical Anesthesia Plan  ASA: II  Anesthesia Plan: General   Post-op Pain Management:    Induction: Intravenous  PONV Risk Score and Plan: 3 and Ondansetron, Dexamethasone and Treatment may vary due to age or medical condition  Airway Management Planned: Oral ETT  Additional Equipment:   Intra-op Plan:   Post-operative Plan: Extubation in OR  Informed Consent: I have reviewed the patients History and Physical, chart, labs and discussed the procedure including the risks, benefits and alternatives for the proposed anesthesia with the patient or authorized representative who has indicated his/her understanding and acceptance.     Dental advisory given  Plan Discussed with: CRNA and Surgeon  Anesthesia Plan Comments:         Anesthesia Quick Evaluation  

## 2019-06-14 NOTE — Transfer of Care (Signed)
Immediate Anesthesia Transfer of Care Note  Patient: Alicia Richardson  Procedure(s) Performed: HAND ASSISTED LAPAROSCOPIC RADICAL NEPHRECTOMY (Right )  Patient Location: PACU  Anesthesia Type:General  Level of Consciousness: awake, alert  and oriented  Airway & Oxygen Therapy: Patient Spontanous Breathing and Patient connected to face mask oxygen  Post-op Assessment: Report given to RN and Post -op Vital signs reviewed and stable  Post vital signs: Reviewed and stable  Last Vitals:  Vitals Value Taken Time  BP 144/105 06/14/19 1051  Temp    Pulse 69 06/14/19 1054  Resp 18 06/14/19 1054  SpO2 100 % 06/14/19 1054  Vitals shown include unvalidated device data.  Last Pain:  Vitals:   06/14/19 0701  TempSrc:   PainSc: 0-No pain      Patients Stated Pain Goal: 3 ([id number])  Complications: No apparent anesthesia complications

## 2019-06-14 NOTE — Anesthesia Procedure Notes (Signed)
Procedure Name: Intubation Date/Time: 06/14/2019 8:48 AM Performed by: Eben Burow, CRNA Pre-anesthesia Checklist: Patient identified, Emergency Drugs available, Suction available, Patient being monitored and Timeout performed Patient Re-evaluated:Patient Re-evaluated prior to induction Oxygen Delivery Method: Circle system utilized Preoxygenation: Pre-oxygenation with 100% oxygen Induction Type: IV induction Ventilation: Mask ventilation without difficulty Laryngoscope Size: Mac and 3 Tube type: Oral Tube size: 7.0 mm Number of attempts: 1 Airway Equipment and Method: Stylet Placement Confirmation: ETT inserted through vocal cords under direct vision,  positive ETCO2 and breath sounds checked- equal and bilateral Secured at: 22 cm Tube secured with: Tape Dental Injury: Teeth and Oropharynx as per pre-operative assessment

## 2019-06-15 ENCOUNTER — Encounter (HOSPITAL_COMMUNITY): Payer: Self-pay | Admitting: Urology

## 2019-06-15 LAB — BASIC METABOLIC PANEL
Anion gap: 8 (ref 5–15)
BUN: 19 mg/dL (ref 8–23)
CO2: 22 mmol/L (ref 22–32)
Calcium: 8.3 mg/dL — ABNORMAL LOW (ref 8.9–10.3)
Chloride: 107 mmol/L (ref 98–111)
Creatinine, Ser: 1.15 mg/dL — ABNORMAL HIGH (ref 0.44–1.00)
GFR calc Af Amer: 52 mL/min — ABNORMAL LOW (ref >60)
GFR calc non Af Amer: 45 mL/min — ABNORMAL LOW (ref >60)
Glucose, Bld: 131 mg/dL — ABNORMAL HIGH (ref 70–99)
Potassium: 3.9 mmol/L (ref 3.5–5.1)
Sodium: 137 mmol/L (ref 135–145)

## 2019-06-15 LAB — HEMOGLOBIN AND HEMATOCRIT, BLOOD
HCT: 33.9 % — ABNORMAL LOW (ref 36.0–46.0)
Hemoglobin: 10.6 g/dL — ABNORMAL LOW (ref 12.0–15.0)

## 2019-06-15 MED ORDER — LORAZEPAM 0.5 MG PO TABS
0.5000 mg | ORAL_TABLET | Freq: Four times a day (QID) | ORAL | Status: DC | PRN
  Administered 2019-06-15: 0.5 mg via ORAL
  Filled 2019-06-15: qty 1

## 2019-06-15 NOTE — Progress Notes (Addendum)
1 Day Post-Op Subjective: Renal mass- S/p hand assisted laparoscopic right nephrectomy on 06/14/2019 for large renal mass.   9/17: Doing well overnight. Non focal upper abdominal pain controlled with medication. Has not walked yet. No nausea/emesis. Drinking water fine.  AF, VSS Hgb 10.6 UOP adeqaute.   Objective: Vital signs in last 24 hours: Temp:  [97.7 F (36.5 C)-99.2 F (37.3 C)] 99.2 F (37.3 C) (09/17 1256) Pulse Rate:  [71-90] 72 (09/17 1256) Resp:  [16-20] 18 (09/17 1256) BP: (129-153)/(66-83) 133/70 (09/17 1256) SpO2:  [91 %-95 %] 93 % (09/17 1256) Weight:  [69.2 kg] 69.2 kg (09/16 1548)  Intake/Output from previous day: 09/16 0701 - 09/17 0700 In: 2430 [P.O.:360; I.V.:2020; IV Piggyback:50] Out: [id number]; Blood:100] Intake/Output this shift: Total I/O In: 525 [P.O.:150; I.V.:375] Out: 550 [Urine:550]  Physical Exam:  General:no acute distress GI: soft, appropriately tender. Dressings over incisions clean/dry. Foley with light yellow urine  Lab Results: Recent Labs    06/14/19 1129 06/15/19 0436  HGB 12.5 10.6*  HCT 39.5 33.9*   BMET Recent Labs    06/14/19 1129 06/15/19 0436  NA 140 137  K 3.6 3.9  CL 107 107  CO2 23 22  GLUCOSE 121* 131*  BUN 20 19  CREATININE 0.77 1.15*  CALCIUM 8.6* 8.3*   No results for input(s): LABPT, INR in the last 72 hours. No results for input(s): LABURIN in the last 72 hours. Results for orders placed or performed during the hospital encounter of 06/10/19  Novel Coronavirus, NAA (Hosp order, Send-out to Ref Lab; TAT 18-24 hrs     Status: None   Collection Time: 06/10/19 11:31 AM   Specimen: Nasopharyngeal Swab; Respiratory  Result Value Ref Range Status   SARS-CoV-2, NAA NOT DETECTED NOT DETECTED Final    Comment: (NOTE) This nucleic acid amplification test was developed and its performance characteristics determined by [employer]. Nucleic acid amplification tests include PCR and TMA. This  test has not been FDA cleared or approved. This test has been authorized by FDA under an Emergency Use Authorization (EUA). This test is only authorized for the duration of time the declaration that circumstances exist justifying the authorization of the emergency use of in vitro diagnostic tests for detection of SARS-CoV-2 virus and/or diagnosis of COVID-19 infection under section 564(b)(1) of the Act, 21 U.S.C. [id number]) (1), unless the authorization is terminated or revoked sooner. When diagnostic testing is negative, the possibility of a false negative result should be considered in the context of a patient's recent exposures and the presence of clinical signs and symptoms consistent with COVID-19. An individual without symptoms of COVID- 19 and who is not shedding SARS-CoV-2 vi rus would expect to have a negative (not detected) result in this assay. Performed At: [facility] [address] [place], [place] [id number] Rush Farmer MD [id number]    [place]  Final    Comment: Performed at [facility], [place] [address], [place],  [address]    Studies/Results: No results found.  Assessment/Plan: 84 yo F s/p right laparoscopic nephrectomy for large renal mass without acute complication. Recovering appropriately. Likely discharge tomorrow.  - Continue pain regimen - Advance diet as tolerated - Dc Foley, TOV - OOB x4 today - H/H tomorrow am   LOS: 1 day

## 2019-06-15 NOTE — Progress Notes (Addendum)
Patient has ambulated 3 times per day waling further with each ambulation. Pt continues to get more confused, anxious and tearful as the shift progresses. Pt states that she does not understand why she is here and what is happening. Plan of care reviewed several times with patient. Patient continues to state we are keeping something from her. Delirium precautions initiated. Will continue to monitor.

## 2019-06-16 LAB — HEMOGLOBIN AND HEMATOCRIT, BLOOD
HCT: 32.3 % — ABNORMAL LOW (ref 36.0–46.0)
Hemoglobin: 9.8 g/dL — ABNORMAL LOW (ref 12.0–15.0)

## 2019-06-16 LAB — SURGICAL PATHOLOGY

## 2019-06-16 MED ORDER — HYDROCODONE-ACETAMINOPHEN 5-325 MG PO TABS: 1.0000 | ORAL_TABLET | Freq: Four times a day (QID) | ORAL | 0 refills | Status: DC | PRN

## 2019-06-16 MED ORDER — SENNA 8.6 MG PO TABS: 1.0000 | ORAL_TABLET | Freq: Two times a day (BID) | ORAL | 0 refills | Status: DC

## 2019-06-16 MED ORDER — VITAMIN B-12 1000 MCG PO TABS
1000.0000 ug | ORAL_TABLET | Freq: Every day | ORAL | Status: DC
  Administered 2019-06-16: 10:00:00 1000 ug via ORAL
  Filled 2019-06-16: qty 1

## 2019-06-16 MED ORDER — HEPARIN SODIUM (PORCINE) 5000 UNIT/ML IJ SOLN
5000.0000 [IU] | Freq: Three times a day (TID) | INTRAMUSCULAR | Status: DC
  Administered 2019-06-16: 5000 [IU] via SUBCUTANEOUS
  Filled 2019-06-16: qty 1

## 2019-06-16 MED ORDER — DOCUSATE SODIUM 100 MG PO CAPS: 100.0000 mg | ORAL_CAPSULE | Freq: Two times a day (BID) | ORAL | 0 refills | Status: DC

## 2019-06-16 NOTE — Care Management Important Message (Signed)
Important Message  Patient Details IM Letter given to Dessa Phi RN to present to the Patient Name: Alicia Richardson MRN: [id number] Date of Birth: 03/07/1935   Medicare Important Message Given:  Yes     Kerin Salen 06/16/2019, 11:25 AM

## 2019-06-16 NOTE — Discharge Summary (Signed)
Physician Discharge Summary  Patient ID: Alicia Richardson MRN: [id number] DOB/AGE: 02-May-1935 84 y.o.  Admit date: 06/14/2019 Discharge date: 06/16/2019  Admission Diagnoses:  Discharge Diagnoses:  Active Problems:   Renal mass   Discharged Condition: good  Hospital Course:  84 yo F s/p hand assisted laparoscopic right nephrectomy for renal mass on 06/14/2019 without acute complication. On POD1, she demonstrated symptoms of mild delirium in hospital setting and in context of underlying known B12 deficiency; she improved with delirium precautions and re-orientation. Her diet was slowly advanced. She had O2 of 89% intermittently while on room air, but denied chest pain, shortness of breath, calf tenderness; she was able to pull almost 1L on incentive spirometry and ambulate without difficulty or shortness of breath. She had foley catheter removed and passed trial of void. On POD2, she had excellent pain control, was tolerating PO, voiding spontaneously and ambulating well. Dressings were removed and incisions were c/d/i and healing well. She was A&O x4 prior to discharge. She will return for post operative visit in urology clinic.     Discharge Exam: Blood pressure 124/60, pulse 84, temperature 99 F (37.2 C), temperature source Oral, resp. rate 18, height 5\' 2"  (1.575 m), weight 69.2 kg, SpO2 92 %. General:no acute distress  GI: soft, appropriately tender. Incisions c/d/i with staples in place.  Disposition:    Allergies as of 06/16/2019      Reactions   Penicillins Other (See Comments)   Pt states she is not allergic to penicillin Did it involve swelling of the face/tongue/throat, SOB, or low BP? Unknown Did it involve sudden or severe rash/hives, skin peeling, or any reaction on the inside of your mouth or nose? Unknown Did you need to seek medical attention at a hospital or doctor's office? Unknown When did it last happen?unknown If all above answers are "NO", may proceed with  cephalosporin use.      Medication List    TAKE these medications   amLODipine 10 MG tablet Commonly known as: NORVASC Take 1 tablet (10 mg total) by mouth at bedtime.   carvedilol 6.25 MG tablet Commonly known as: COREG Take 1 tablet (6.25 mg total) by mouth 2 (two) times daily with a meal.   cyanocobalamin 1000 MCG tablet Take 1 tablet (1,000 mcg total) by mouth daily.   docusate sodium 100 MG capsule Commonly known as: COLACE Take 1 capsule (100 mg total) by mouth 2 (two) times daily.   multivitamin with minerals Tabs tablet Take 1 tablet by mouth daily.   senna 8.6 MG Tabs tablet Commonly known as: SENOKOT Take 1 tablet (8.6 mg total) by mouth 2 (two) times daily.        Signed: Haskel Schroeder 06/16/2019, 12:53 PM

## 2019-10-09 ENCOUNTER — Telehealth: Payer: Self-pay | Admitting: Internal Medicine

## 2019-10-09 NOTE — Telephone Encounter (Signed)
Good afternoon, Dr. Link Snuffer from [facility] referred this patient to you to evaluate choledocholithiasis.  Labs and CT abd & pelvis will be sent to you for review.

## 2019-10-09 NOTE — Telephone Encounter (Signed)
A message was left on voicemail for patient to call back to schedule.

## 2019-10-09 NOTE — Telephone Encounter (Signed)
OK to schedule  I have a f/u slot on 1/13 at 1050 that could be used

## 2019-10-11 ENCOUNTER — Encounter: Payer: Self-pay | Admitting: Internal Medicine

## 2019-10-11 NOTE — Telephone Encounter (Signed)
This encounter was created in error - please disregard.

## 2019-10-24 ENCOUNTER — Encounter: Payer: Self-pay | Admitting: Urology

## 2019-11-09 ENCOUNTER — Encounter: Payer: Self-pay | Admitting: Family Medicine

## 2019-11-09 NOTE — Telephone Encounter (Signed)
Referral is now closed and will be placed along with records.

## 2020-08-06 ENCOUNTER — Encounter (HOSPITAL_COMMUNITY): Payer: Self-pay | Admitting: Emergency Medicine

## 2020-08-06 ENCOUNTER — Other Ambulatory Visit: Payer: Self-pay

## 2020-08-06 ENCOUNTER — Emergency Department (HOSPITAL_COMMUNITY): Payer: Medicare Other

## 2020-08-06 ENCOUNTER — Inpatient Hospital Stay (HOSPITAL_COMMUNITY)
Admission: EM | Admit: 2020-08-06 | Discharge: 2020-08-13 | DRG: 641 | Disposition: A | Discharge status: Skilled Nursing Facility | Payer: Medicare Other | Attending: Family Medicine | Admitting: Family Medicine

## 2020-08-06 DIAGNOSIS — I10 Essential (primary) hypertension: Secondary | ICD-10-CM | POA: Diagnosis not present

## 2020-08-06 DIAGNOSIS — Z20822 Contact with and (suspected) exposure to covid-19: Secondary | ICD-10-CM | POA: Diagnosis present

## 2020-08-06 DIAGNOSIS — R0902 Hypoxemia: Secondary | ICD-10-CM | POA: Diagnosis present

## 2020-08-06 DIAGNOSIS — N179 Acute kidney failure, unspecified: Secondary | ICD-10-CM | POA: Diagnosis not present

## 2020-08-06 DIAGNOSIS — N1832 Chronic kidney disease, stage 3b: Secondary | ICD-10-CM | POA: Diagnosis present

## 2020-08-06 DIAGNOSIS — Z23 Encounter for immunization: Secondary | ICD-10-CM

## 2020-08-06 DIAGNOSIS — R55 Syncope and collapse: Secondary | ICD-10-CM

## 2020-08-06 DIAGNOSIS — E860 Dehydration: Secondary | ICD-10-CM | POA: Diagnosis not present

## 2020-08-06 DIAGNOSIS — I129 Hypertensive chronic kidney disease with stage 1 through stage 4 chronic kidney disease, or unspecified chronic kidney disease: Secondary | ICD-10-CM | POA: Diagnosis present

## 2020-08-06 DIAGNOSIS — F05 Delirium due to known physiological condition: Secondary | ICD-10-CM | POA: Diagnosis present

## 2020-08-06 DIAGNOSIS — Z905 Acquired absence of kidney: Secondary | ICD-10-CM

## 2020-08-06 DIAGNOSIS — I350 Nonrheumatic aortic (valve) stenosis: Secondary | ICD-10-CM | POA: Diagnosis present

## 2020-08-06 DIAGNOSIS — E538 Deficiency of other specified B group vitamins: Secondary | ICD-10-CM | POA: Diagnosis not present

## 2020-08-06 DIAGNOSIS — I951 Orthostatic hypotension: Secondary | ICD-10-CM

## 2020-08-06 DIAGNOSIS — N183 Chronic kidney disease, stage 3 unspecified: Secondary | ICD-10-CM

## 2020-08-06 DIAGNOSIS — Z85528 Personal history of other malignant neoplasm of kidney: Secondary | ICD-10-CM

## 2020-08-06 DIAGNOSIS — F0390 Unspecified dementia without behavioral disturbance: Secondary | ICD-10-CM | POA: Diagnosis present

## 2020-08-06 DIAGNOSIS — E876 Hypokalemia: Secondary | ICD-10-CM | POA: Diagnosis present

## 2020-08-06 LAB — COMPREHENSIVE METABOLIC PANEL
ALT: 18 U/L (ref 0–44)
AST: 26 U/L (ref 15–41)
Albumin: 3.5 g/dL (ref 3.5–5.0)
Alkaline Phosphatase: 71 U/L (ref 38–126)
Anion gap: 15 (ref 5–15)
BUN: 14 mg/dL (ref 8–23)
CO2: 20 mmol/L — ABNORMAL LOW (ref 22–32)
Calcium: 8.9 mg/dL (ref 8.9–10.3)
Chloride: 105 mmol/L (ref 98–111)
Creatinine, Ser: 1.89 mg/dL — ABNORMAL HIGH (ref 0.44–1.00)
GFR, Estimated: 26 mL/min — ABNORMAL LOW (ref >60)
Glucose, Bld: 111 mg/dL — ABNORMAL HIGH (ref 70–99)
Potassium: 4.7 mmol/L (ref 3.5–5.1)
Sodium: 140 mmol/L (ref 135–145)
Total Bilirubin: 1.5 mg/dL — ABNORMAL HIGH (ref 0.3–1.2)
Total Protein: 6.6 g/dL (ref 6.5–8.1)

## 2020-08-06 LAB — I-STAT VENOUS BLOOD GAS, ED
Acid-base deficit: 2 mmol/L (ref 0.0–2.0)
Bicarbonate: 23.1 mmol/L (ref 20.0–28.0)
Calcium, Ion: 1.12 mmol/L — ABNORMAL LOW (ref 1.15–1.40)
HCT: 42 % (ref 36.0–46.0)
Hemoglobin: 14.3 g/dL (ref 12.0–15.0)
O2 Saturation: 82 %
Potassium: 3.8 mmol/L (ref 3.5–5.1)
Sodium: 142 mmol/L (ref 135–145)
TCO2: 24 mmol/L (ref 22–32)
pCO2, Ven: 37.9 mmHg — ABNORMAL LOW (ref 44.0–60.0)
pH, Ven: 7.393 (ref 7.250–7.430)
pO2, Ven: 47 mmHg — ABNORMAL HIGH (ref 32.0–45.0)

## 2020-08-06 LAB — CBC WITH DIFFERENTIAL/PLATELET
Abs Immature Granulocytes: 0.02 10*3/uL (ref 0.00–0.07)
Basophils Absolute: 0 10*3/uL (ref 0.0–0.1)
Basophils Relative: 1 %
Eosinophils Absolute: 0.1 10*3/uL (ref 0.0–0.5)
Eosinophils Relative: 1 %
HCT: 43.5 % (ref 36.0–46.0)
Hemoglobin: 13.1 g/dL (ref 12.0–15.0)
Immature Granulocytes: 0 %
Lymphocytes Relative: 32 %
Lymphs Abs: 2.1 10*3/uL (ref 0.7–4.0)
MCH: 27.3 pg (ref 26.0–34.0)
MCHC: 30.1 g/dL (ref 30.0–36.0)
MCV: 90.6 fL (ref 80.0–100.0)
Monocytes Absolute: 0.5 10*3/uL (ref 0.1–1.0)
Monocytes Relative: 8 %
Neutro Abs: 3.9 10*3/uL (ref 1.7–7.7)
Neutrophils Relative %: 58 %
Platelets: 158 10*3/uL (ref 150–400)
RBC: 4.8 MIL/uL (ref 3.87–5.11)
RDW: 14.2 % (ref 11.5–15.5)
WBC: 6.8 10*3/uL (ref 4.0–10.5)
nRBC: 0 % (ref 0.0–0.2)

## 2020-08-06 LAB — BRAIN NATRIURETIC PEPTIDE: B Natriuretic Peptide: 38 pg/mL (ref 0.0–100.0)

## 2020-08-06 LAB — RESPIRATORY PANEL BY RT PCR (FLU A&B, COVID)
Influenza A by PCR: NEGATIVE
Influenza B by PCR: NEGATIVE
SARS Coronavirus 2 by RT PCR: NEGATIVE

## 2020-08-06 LAB — LACTIC ACID, PLASMA: Lactic Acid, Venous: 1 mmol/L (ref 0.5–1.9)

## 2020-08-06 LAB — AMMONIA: Ammonia: 19 umol/L (ref 9–35)

## 2020-08-06 LAB — CBG MONITORING, ED: Glucose-Capillary: 107 mg/dL — ABNORMAL HIGH (ref 70–99)

## 2020-08-06 LAB — TROPONIN I (HIGH SENSITIVITY)
Troponin I (High Sensitivity): 12 ng/L (ref <18)
Troponin I (High Sensitivity): 13 ng/L (ref <18)

## 2020-08-06 LAB — TSH: TSH: 1.166 u[IU]/mL (ref 0.350–4.500)

## 2020-08-06 MED ORDER — SODIUM CHLORIDE 0.9% FLUSH
3.0000 mL | Freq: Two times a day (BID) | INTRAVENOUS | Status: DC
  Administered 2020-08-07 – 2020-08-13 (×10): 3 mL via INTRAVENOUS

## 2020-08-06 MED ORDER — LABETALOL HCL 5 MG/ML IV SOLN
10.0000 mg | INTRAVENOUS | Status: DC | PRN
  Administered 2020-08-06 – 2020-08-12 (×4): 10 mg via INTRAVENOUS
  Filled 2020-08-06 (×4): qty 4

## 2020-08-06 MED ORDER — SODIUM CHLORIDE 0.9 % IV SOLN: INTRAVENOUS | Status: DC

## 2020-08-06 MED ORDER — SODIUM CHLORIDE 0.9 % IV BOLUS
1000.0000 mL | Freq: Once | INTRAVENOUS | Status: AC
  Administered 2020-08-06: 1000 mL via INTRAVENOUS

## 2020-08-06 MED ORDER — HYDRALAZINE HCL 25 MG PO TABS
25.0000 mg | ORAL_TABLET | ORAL | Status: DC | PRN
  Administered 2020-08-06: 25 mg via ORAL
  Filled 2020-08-06 (×2): qty 1

## 2020-08-06 MED ORDER — CYANOCOBALAMIN 1000 MCG/ML IJ SOLN
1000.0000 ug | Freq: Every day | INTRAMUSCULAR | Status: DC
  Administered 2020-08-06 – 2020-08-07 (×2): 1000 ug via SUBCUTANEOUS
  Filled 2020-08-06 (×2): qty 1

## 2020-08-06 MED ORDER — ENOXAPARIN SODIUM 30 MG/0.3ML ~~LOC~~ SOLN
30.0000 mg | Freq: Every day | SUBCUTANEOUS | Status: DC
  Administered 2020-08-07 – 2020-08-13 (×6): 30 mg via SUBCUTANEOUS
  Filled 2020-08-06 (×6): qty 0.3

## 2020-08-06 NOTE — Assessment & Plan Note (Signed)
-   last B12 level 175 on 05/2019 - repeat B12 now - continue on supplementation

## 2020-08-06 NOTE — ED Notes (Signed)
Attempted to call report- inpt floor unable to accept report at this time will call back when ready.

## 2020-08-06 NOTE — ED Notes (Addendum)
Updated pt's brother at pt request.  Alicia Richardson [phone]

## 2020-08-06 NOTE — Hospital Course (Addendum)
Alicia Richardson is an 85 yo female with PMH renal mass (s/p right nephrectomy 06/14/19), B12 deficiency, HTN, SVT (s/p ablation 2009) , CKD3 who presented to the ER after a witnessed syncopal episode at home.  Neighbors had noticed the patient walking to her mailbox leaning to the left; upon helping her sit down, she lost consciousness.  Upon EMS arrival she was hypotensive SBP 70, hypoxic (80% on room air), and unresponsive.  Just prior to EMS attempting to intubate patient, she became more responsive and blood pressure improved.  Since then, she has been slowly improving mentally since arrival to the ER as well as improvement in her blood pressure.  Vitals in the ER were relatively stable and unremarkable with no hypotension noted.  SBP ranged 150-160 and DBP 70s-80s.  CBC was normal Chemistries relatively unremarkable aside from mild increase in creatinine (1.89, baseline approximately 0.9 - 1) Trop negative x 2 BNP 38 Covid and flu negative Ammonia negative, lactic normal, TSH normal VBG 7.39/37  Orthostatic blood pressures were ordered while in the ER and found to be positive (151/141 >> 165/135 >> 167/87). HR 77>89>98  CXR was clear with no infiltrates, edema, effusions CT head showed chronic microvascular ischemic disease, remote lacunar infarcts in bilateral basal ganglia, and ventricular dilation (similar on Sept 2020 [facility]).   She did endorse that she has felt dizzy over the past couple weeks.  She also noted that she has not been eating or drinking as much as usual however does state that she has been urinating her same amount with no difficulty.  She denies any nausea/vomiting.  She does endorse the occasional episode of diarrhea approximately once a week but nothing sustained or severe. She does have old antihypertensive medications listed on her med rec (currently in process) however states that the only medications she takes currently is a vitamin B12 and other vitamin supplements.  She does  not take any prescription medications at this time she says.  She lives alone and takes care of herself.  She does not drive and her activity consists of using her cane to check the mail daily and getting around the house.  She is admitted for further syncope workup and echo.
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
## 2020-08-06 NOTE — H&P (Signed)
History and Physical    Alicia Richardson  TMH:[id number]  DOB: December 06, 1934  DOA: 08/06/2020  PCP: [name], MD Patient coming from: home  Chief Complaint: passed out   HPI:  Alicia Richardson is an 85 yo female with PMH renal mass (s/p right nephrectomy 06/14/19), B12 deficiency, HTN, SVT (s/p ablation 2009) , CKD3 who presented to the ER after a witnessed syncopal episode at home.  Neighbors had noticed the patient walking to her mailbox leaning to the left; upon helping her sit down, she lost consciousness.  Upon EMS arrival she was hypotensive SBP 70, hypoxic (80% on room air), and unresponsive.  Just prior to EMS attempting to intubate patient, she became more responsive and blood pressure improved.  Since then, she has been slowly improving mentally since arrival to the ER as well as improvement in her blood pressure.  Vitals in the ER were relatively stable and unremarkable with no hypotension noted.  SBP ranged 150-160 and DBP 70s-80s.  CBC was normal Chemistries relatively unremarkable aside from mild increase in creatinine (1.89, baseline approximately 0.9 - 1) Trop negative x 2 BNP 38 Covid and flu negative Ammonia negative, lactic normal, TSH normal VBG 7.39/37  Orthostatic blood pressures were ordered while in the ER and found to be positive (151/141 >> 165/135 >> 167/87). HR 77>89>98  CXR was clear with no infiltrates, edema, effusions CT head showed chronic microvascular ischemic disease, remote lacunar infarcts in bilateral basal ganglia, and ventricular dilation (similar on Sept 2020 [facility]).   She did endorse that she has felt dizzy over the past couple weeks.  She also noted that she has not been eating or drinking as much as usual however does state that she has been urinating her same amount with no difficulty.  She denies any nausea/vomiting.  She does endorse the occasional episode of diarrhea approximately once a week but nothing sustained or severe. She does have old  antihypertensive medications listed on her med rec (currently in process) however states that the only medications she takes currently is a vitamin B12 and other vitamin supplements.  She does not take any prescription medications at this time she says.  She lives alone and takes care of herself.  She does not drive and her activity consists of using her cane to check the mail daily and getting around the house.  She is admitted for further syncope workup and echo.    I have personally briefly reviewed patient's old medical records in [facility] and discussed patient with the ER provider when appropriate/indicated.  Assessment/Plan: * Syncope -Orthostatics ended up being positive when checked in the ER.  She may be volume depleted from decreased oral intake recently.  Still need to follow-up pending med rec as she previously has been on blood pressure medications but is telling me she no longer takes anything other than vitamins -Follow-up echo - monitor on tele  B12 deficiency - last B12 level 175 on 05/2019 - repeat B12 now - continue on supplementation   Acute renal failure superimposed on stage 3 chronic kidney disease (HCC) - baseline creat ~ 0.9 - 1, presents with creat 1.89.  She does not have any values for comparison after her right-sided nephrectomy and current renal function may be due to having solitary kidney now -Other considered etiology includes progressive decline vs volume depletion (given syncope and orthostasis) - start on IVF and repeat BMP in am   Essential hypertension - hold home meds for now (until  med rec complete) although BP already starting to rebound in ER - use labetalol or hydralazine PRN for now     Code Status: Full DVT Prophylaxis: Lovenox Anticipated disposition is to: Home  History: Past Medical History:  Diagnosis Date  . Cancer ([place])    kidney  . Dysrhythmia 2009   SVT-ablation by Dr Lovena Le 09-24-08  . Hypertension   . Mass on back    . Right renal mass     Past Surgical History:  Procedure Laterality Date  . ABLATION OF DYSRHYTHMIC FOCUS  09/24/2008   by Dr Lovena Le for SVT  . APPENDECTOMY    . LAPAROSCOPIC NEPHRECTOMY, HAND ASSISTED Right 06/14/2019   Procedure: HAND ASSISTED LAPAROSCOPIC RADICAL NEPHRECTOMY;  Surgeon: Lucas Mallow, MD;  Location: [facility];  Service: Urology;  Laterality: Right;  . MASS EXCISION Right 02/11/2018   Procedure: EXCISION RIGHT UPPER BACK  MASS;  Surgeon: Coralie Keens, MD;  Location: [place];  Service: General;  Laterality: Right;  . TONSILLECTOMY       reports that she has never smoked. She has never used smokeless tobacco. She reports current alcohol use. She reports that she does not use drugs.  Allergies  Allergen Reactions  . Penicillins Other (See Comments)    Pt states she is not allergic to penicillin (??) Did it involve swelling of the face/tongue/throat, SOB, or low BP? Unk Did it involve sudden or severe rash/hives, skin peeling, or any reaction on the inside of your mouth or nose? Unk Did you need to seek medical attention at a hospital or doctor's office? Unk When did it last happen?Unk If all above answers are "NO", may proceed with cephalosporin use.    Family History  Problem Relation Age of Onset  . Kidney disease Mother   . Heart attack Brother    Home Medications: Prior to Admission medications   Medication Sig Start Date End Date Taking? Authorizing Provider  ascorbic acid (VITAMIN C) 500 MG tablet Take 500 mg by mouth daily.   Yes [provider]  vitamin B-12 1000 MCG tablet Take 1 tablet (1,000 mcg total) by mouth daily. 06/08/19  Yes Lavina Hamman, MD  VITAMIN E PO Take 1 capsule by mouth daily with breakfast.   Yes [provider]  amLODipine (NORVASC) 10 MG tablet Take 1 tablet (10 mg total) by mouth at bedtime. Patient not taking: Reported on 08/06/2020 06/07/19 08/06/20  Lavina Hamman, MD  carvedilol  (COREG) 6.25 MG tablet Take 1 tablet (6.25 mg total) by mouth 2 (two) times daily with a meal. Patient not taking: Reported on 08/06/2020 06/07/19   Lavina Hamman, MD  docusate sodium (COLACE) 100 MG capsule Take 1 capsule (100 mg total) by mouth 2 (two) times daily. Patient not taking: Reported on 08/06/2020 06/16/19   Haskel Schroeder, MD  HYDROcodone-acetaminophen [facility]) 5-325 MG tablet Take 1-2 tablets by mouth every 6 (six) hours as needed. Patient not taking: Reported on 08/06/2020 06/16/19   Debbrah Alar, PA-C  senna (SENOKOT) 8.6 MG TABS tablet Take 1 tablet (8.6 mg total) by mouth 2 (two) times daily. Patient not taking: Reported on 08/06/2020 06/16/19   Haskel Schroeder, MD    Review of Systems:  Pertinent items noted in HPI and remainder of comprehensive ROS otherwise negative.  Physical Exam: Vitals:   08/06/20 1445 08/06/20 1500 08/06/20 1655 08/06/20 1700  BP: (!) 157/75 (!) 163/79 (!) 176/91 (!) 157/97  Pulse: 73 76 72  73  Resp: (!) 29 16 17 15   Temp:   98.3 F (36.8 C)   TempSrc:   Oral   SpO2: 96% 99% 97% 99%   General appearance: alert, cooperative, no distress and slowed mentation Head: Normocephalic, without obvious abnormality, atraumatic Eyes: EOMI Lungs: clear to auscultation bilaterally Heart: regular rate and rhythm, S1, S2 normal and 3/6 HSM best heard in RUSB Abdomen: normal findings: bowel sounds normal and soft, non-tender Extremities: no edema Skin: mobility and turgor normal Neurologic: Grossly normal  Labs on Admission:  I have personally reviewed following labs and imaging studies Results for orders placed or performed during the hospital encounter of 08/06/20 (from the past 24 hour(s))  CBG monitoring, ED     Status: Abnormal   Collection Time: 08/06/20 12:25 PM  Result Value Ref Range   Glucose-Capillary 107 (H) 70 - 99 mg/dL  CBC with Differential/Platelet     Status: None   Collection Time: 08/06/20 12:40 PM  Result Value Ref Range    WBC 6.8 4.0 - 10.5 K/uL   RBC 4.80 3.87 - 5.11 MIL/uL   Hemoglobin 13.1 12.0 - 15.0 g/dL   HCT 43.5 36 - 46 %   MCV 90.6 80.0 - 100.0 fL   MCH 27.3 26.0 - 34.0 pg   MCHC 30.1 30.0 - 36.0 g/dL   RDW 14.2 11.5 - 15.5 %   Platelets 158 150 - 400 K/uL   nRBC 0.0 0.0 - 0.2 %   Neutrophils Relative % 58 %   Neutro Abs 3.9 1.7 - 7.7 K/uL   Lymphocytes Relative 32 %   Lymphs Abs 2.1 0.7 - 4.0 K/uL   Monocytes Relative 8 %   Monocytes Absolute 0.5 0.1 - 1.0 K/uL   Eosinophils Relative 1 %   Eosinophils Absolute 0.1 0.0 - 0.5 K/uL   Basophils Relative 1 %   Basophils Absolute 0.0 0.0 - 0.1 K/uL   Immature Granulocytes 0 %   Abs Immature Granulocytes 0.02 0.00 - 0.07 K/uL  Comprehensive metabolic panel     Status: Abnormal   Collection Time: 08/06/20 12:40 PM  Result Value Ref Range   Sodium 140 135 - 145 mmol/L   Potassium 4.7 3.5 - 5.1 mmol/L   Chloride 105 98 - 111 mmol/L   CO2 20 (L) 22 - 32 mmol/L   Glucose, Bld 111 (H) 70 - 99 mg/dL   BUN 14 8 - 23 mg/dL   Creatinine, Ser 1.89 (H) 0.44 - 1.00 mg/dL   Calcium 8.9 8.9 - 10.3 mg/dL   Total Protein 6.6 6.5 - 8.1 g/dL   Albumin 3.5 3.5 - 5.0 g/dL   AST 26 15 - 41 U/L   ALT 18 0 - 44 U/L   Alkaline Phosphatase 71 38 - 126 U/L   Total Bilirubin 1.5 (H) 0.3 - 1.2 mg/dL   GFR, Estimated 26 (L) >60 mL/min   Anion gap 15 5 - 15  Troponin I (High Sensitivity)     Status: None   Collection Time: 08/06/20 12:40 PM  Result Value Ref Range   Troponin I (High Sensitivity) 12 <18 ng/L  Brain natriuretic peptide     Status: None   Collection Time: 08/06/20 12:41 PM  Result Value Ref Range   B Natriuretic Peptide 38.0 0.0 - 100.0 pg/mL  Respiratory Panel by RT PCR (Flu A&B, Covid) - Nasopharyngeal Swab     Status: None   Collection Time: 08/06/20  2:05 PM   Specimen: Nasopharyngeal Swab  Result Value Ref Range   SARS Coronavirus 2 by RT PCR NEGATIVE NEGATIVE   Influenza A by PCR NEGATIVE NEGATIVE   Influenza B by PCR NEGATIVE NEGATIVE    Ammonia     Status: None   Collection Time: 08/06/20  2:15 PM  Result Value Ref Range   Ammonia 19 9 - 35 umol/L  Lactic acid, plasma     Status: None   Collection Time: 08/06/20  2:15 PM  Result Value Ref Range   Lactic Acid, Venous 1.0 0.5 - 1.9 mmol/L  TSH     Status: None   Collection Time: 08/06/20  2:15 PM  Result Value Ref Range   TSH 1.166 0.350 - 4.500 uIU/mL  Troponin I (High Sensitivity)     Status: None   Collection Time: 08/06/20  2:15 PM  Result Value Ref Range   Troponin I (High Sensitivity) 13 <18 ng/L  I-Stat venous blood gas, ED     Status: Abnormal   Collection Time: 08/06/20  2:22 PM  Result Value Ref Range   pH, Ven 7.393 7.25 - 7.43   pCO2, Ven 37.9 (L) 44 - 60 mmHg   pO2, Ven 47.0 (H) 32 - 45 mmHg   Bicarbonate 23.1 20.0 - 28.0 mmol/L   TCO2 24 22 - 32 mmol/L   O2 Saturation 82.0 %   Acid-base deficit 2.0 0.0 - 2.0 mmol/L   Sodium 142 135 - 145 mmol/L   Potassium 3.8 3.5 - 5.1 mmol/L   Calcium, Ion 1.12 (L) 1.15 - 1.40 mmol/L   HCT 42.0 36 - 46 %   Hemoglobin 14.3 12.0 - 15.0 g/dL   Sample type VENOUS      Radiological Exams on Admission: DG Chest 2 View  Result Date: 08/06/2020 CLINICAL DATA:  Syncope for 1 day. EXAM: CHEST - 2 VIEW COMPARISON:  One-view chest x-ray 06/06/2019 FINDINGS: The heart is enlarged. No edema or effusion is present. No focal airspace disease is evident. Degenerative changes in the thoracic spine are stable. IMPRESSION: 1. Stable cardiomegaly without failure. 2. No acute cardiopulmonary disease. Electronically Signed   By: San Morelle M.D.   On: 08/06/2020 13:24   CT HEAD WO CONTRAST  Result Date: 08/06/2020 CLINICAL DATA:  Altered mental status. EXAM: CT HEAD WITHOUT CONTRAST TECHNIQUE: Contiguous axial images were obtained from the base of the skull through the vertex without intravenous contrast. COMPARISON:  06/06/2019. FINDINGS: Brain: No evidence of acute large vascular territory infarction, hemorrhage,  hydrocephalus, extra-axial collection or mass lesion/mass effect. Similar patchy white matter hypoattenuation, compatible with chronic microvascular ischemic disease. Similar small remote lacunar infarcts in bilateral basal ganglia. Similar generalized cerebral volume loss with ex vacuo ventricular dilation. Vascular: Calcific atherosclerosis Skull: No acute fracture.  Hyperostosis frontalis. Sinuses/Orbits: No acute findings. Other: No mastoid effusions. IMPRESSION: 1. No evidence of acute intracranial abnormality. 2. Similar chronic microvascular ischemic disease, generalized atrophy and remote lacunar infarcts in bilateral basal ganglia. Electronically Signed   By: Margaretha Sheffield MD   On: 08/06/2020 13:49   CT HEAD WO CONTRAST  Final Result    DG Chest 2 View  Final Result      Consults called:  none   EKG: Independently reviewed. NSR, QTc 478   Dwyane Dee, MD [facility] 08/06/2020, 5:46 PM

## 2020-08-06 NOTE — Assessment & Plan Note (Addendum)
-   hold home meds for now (until med rec complete) although BP already starting to rebound in ER - use labetalol or hydralazine PRN for now

## 2020-08-06 NOTE — Assessment & Plan Note (Addendum)
-   baseline creat ~ 0.9 - 1, presents with creat 1.89.  She does not have any values for comparison after her right-sided nephrectomy and current renal function may be due to having solitary kidney now -Other considered etiology includes progressive decline vs volume depletion (given syncope and orthostasis) - start on IVF and repeat BMP in am
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
## 2020-08-06 NOTE — Assessment & Plan Note (Addendum)
-  Orthostatics ended up being positive when checked in the ER.  She may be volume depleted from decreased oral intake recently.  Still need to follow-up pending med rec as she previously has been on blood pressure medications but is telling me she no longer takes anything other than vitamins -Follow-up echo - monitor on tele

## 2020-08-06 NOTE — ED Notes (Signed)
Report given to inpt RN

## 2020-08-07 ENCOUNTER — Observation Stay (HOSPITAL_COMMUNITY): Payer: Medicare Other

## 2020-08-07 DIAGNOSIS — E538 Deficiency of other specified B group vitamins: Secondary | ICD-10-CM | POA: Diagnosis not present

## 2020-08-07 DIAGNOSIS — I361 Nonrheumatic tricuspid (valve) insufficiency: Secondary | ICD-10-CM

## 2020-08-07 DIAGNOSIS — R55 Syncope and collapse: Secondary | ICD-10-CM | POA: Diagnosis not present

## 2020-08-07 DIAGNOSIS — I352 Nonrheumatic aortic (valve) stenosis with insufficiency: Secondary | ICD-10-CM

## 2020-08-07 DIAGNOSIS — N179 Acute kidney failure, unspecified: Secondary | ICD-10-CM | POA: Diagnosis not present

## 2020-08-07 DIAGNOSIS — I951 Orthostatic hypotension: Secondary | ICD-10-CM

## 2020-08-07 DIAGNOSIS — I10 Essential (primary) hypertension: Secondary | ICD-10-CM | POA: Diagnosis not present

## 2020-08-07 DIAGNOSIS — N1831 Chronic kidney disease, stage 3a: Secondary | ICD-10-CM

## 2020-08-07 LAB — ECHOCARDIOGRAM COMPLETE
AR max vel: 1.8 cm2
AV Area VTI: 1.64 cm2
AV Area mean vel: 2.09 cm2
AV Mean grad: 9 mmHg
AV Peak grad: 22.5 mmHg
Ao pk vel: 2.37 m/s
Area-P 1/2: 2.8 cm2
Height: 62 in
P 1/2 time: 476 msec
S' Lateral: 2.6 cm
Weight: 2459.2 oz

## 2020-08-07 LAB — BASIC METABOLIC PANEL
Anion gap: 8 (ref 5–15)
BUN: 13 mg/dL (ref 8–23)
CO2: 25 mmol/L (ref 22–32)
Calcium: 8.1 mg/dL — ABNORMAL LOW (ref 8.9–10.3)
Chloride: 110 mmol/L (ref 98–111)
Creatinine, Ser: 1.61 mg/dL — ABNORMAL HIGH (ref 0.44–1.00)
GFR, Estimated: 32 mL/min — ABNORMAL LOW (ref >60)
Glucose, Bld: 116 mg/dL — ABNORMAL HIGH (ref 70–99)
Potassium: 3.2 mmol/L — ABNORMAL LOW (ref 3.5–5.1)
Sodium: 143 mmol/L (ref 135–145)

## 2020-08-07 LAB — CBC
HCT: 35.7 % — ABNORMAL LOW (ref 36.0–46.0)
Hemoglobin: 11.2 g/dL — ABNORMAL LOW (ref 12.0–15.0)
MCH: 27.7 pg (ref 26.0–34.0)
MCHC: 31.4 g/dL (ref 30.0–36.0)
MCV: 88.1 fL (ref 80.0–100.0)
Platelets: 145 10*3/uL — ABNORMAL LOW (ref 150–400)
RBC: 4.05 MIL/uL (ref 3.87–5.11)
RDW: 14.1 % (ref 11.5–15.5)
WBC: 6.9 10*3/uL (ref 4.0–10.5)
nRBC: 0 % (ref 0.0–0.2)

## 2020-08-07 LAB — VITAMIN B12: Vitamin B-12: >7500 pg/mL — ABNORMAL HIGH (ref 180–914)

## 2020-08-07 LAB — MAGNESIUM: Magnesium: 1.6 mg/dL — ABNORMAL LOW (ref 1.7–2.4)

## 2020-08-07 LAB — TSH: TSH: 0.915 u[IU]/mL (ref 0.350–4.500)

## 2020-08-07 MED ORDER — INFLUENZA VAC A&B SA ADJ QUAD 0.5 ML IM PRSY
0.5000 mL | PREFILLED_SYRINGE | INTRAMUSCULAR | Status: DC
  Filled 2020-08-07: qty 0.5

## 2020-08-07 MED ORDER — MAGNESIUM SULFATE 2 GM/50ML IV SOLN
2.0000 g | Freq: Once | INTRAVENOUS | Status: AC
  Administered 2020-08-07: 2 g via INTRAVENOUS
  Filled 2020-08-07: qty 50

## 2020-08-07 MED ORDER — AMLODIPINE BESYLATE 10 MG PO TABS
10.0000 mg | ORAL_TABLET | Freq: Every day | ORAL | Status: DC
  Administered 2020-08-07 – 2020-08-13 (×7): 10 mg via ORAL
  Filled 2020-08-07 (×7): qty 1

## 2020-08-07 NOTE — Evaluation (Signed)
Physical Therapy Evaluation Patient Details Name: Alicia Richardson MRN: [id number] DOB: 20-Apr-1935 Today's Date: 08/07/2020   History of Present Illness  85 yo female with onset of syncopal episode was admitted and nursing cleared orthostatics as source of issue.  Pt is in acute renal failure but no changes on telemetry.  AKI, aortic stenosis, and low Ma+.  PMHx:  HTN, B12 deficiency, nephrectomy, SVT with ablation, CKD3,   Clinical Impression  Pt was seen for update of her mobility on RW, which is greater support than previously needed.  Pt feels she can count on her younger daughter to come stay with her a few days, as previously her older daughter was her support and caregiver, but has passed away. Follow acutely for strengthening and balance training, and work on safety with gait and management of O2 with sats remaining in mid 90's during walk with 2L O2.      Follow Up Recommendations Home health PT;Supervision/Assistance - 24 hour    Equipment Recommendations  Rolling walker with 5" wheels    Recommendations for Other Services       Precautions / Restrictions Precautions Precautions: Fall Restrictions Weight Bearing Restrictions: No      Mobility  Bed Mobility Overal bed mobility: Needs Assistance Bed Mobility: Supine to Sit;Sit to Supine     Supine to sit: Min assist Sit to supine: Min assist   General bed mobility comments: pt requires cues for sequencing but is able to move with minor help    Transfers Overall transfer level: Needs assistance Equipment used: Rolling walker (2 wheeled);1 person hand held assist Transfers: Sit to/from Stand Sit to Stand: Min guard         General transfer comment: pt is comfortable to walk with min guard from PT  Ambulation/Gait Ambulation/Gait assistance: Min guard Gait Distance (Feet): 60 Feet (30 x 2) Assistive device: Rolling walker (2 wheeled);1 person hand held assist Gait Pattern/deviations: Step-through  pattern;Decreased stride length Gait velocity: reduced Gait velocity interpretation: <1.31 ft/sec, indicative of household ambulator General Gait Details: pt is able to clear feet on floor, able to turn walker and requires help mainly with her lines  Stairs            Wheelchair Mobility    Modified Rankin (Stroke Patients Only)       Balance Overall balance assessment: Needs assistance Sitting-balance support: Feet supported Sitting balance-Leahy Scale: Fair     Standing balance support: Bilateral upper extremity supported;During functional activity Standing balance-Leahy Scale: Poor Standing balance comment: with walker can control balance on walk across the room                             Pertinent Vitals/Pain Pain Assessment: No/denies pain    Home Living Family/patient expects to be discharged to:: Private residence Living Arrangements: Alone Available Help at Discharge: Family;Available PRN/intermittently Type of Home: House Home Access: Stairs to enter Entrance Stairs-Rails: Can reach both;Left;Right Entrance Stairs-Number of Steps: 6 Home Layout: One level Home Equipment: Cane - single point;Grab bars - toilet;Grab bars - tub/shower Additional Comments: takes a bath in the sink at home    Prior Function Level of Independence: Independent with assistive device(s)               Hand Dominance   Dominant Hand: Right    Extremity/Trunk Assessment   Upper Extremity Assessment Upper Extremity Assessment: Generalized weakness    Lower Extremity Assessment Lower Extremity  Assessment: Generalized weakness    Cervical / Trunk Assessment Cervical / Trunk Assessment: Kyphotic  Communication   Communication: No difficulties  Cognition Arousal/Alertness: Awake/alert Behavior During Therapy: WFL for tasks assessed/performed Overall Cognitive Status: Within Functional Limits for tasks assessed                                         General Comments General comments (skin integrity, edema, etc.): Pt was seen for mobility on RW with     Exercises     Assessment/Plan    PT Assessment Patient needs continued PT services  PT Problem List Decreased strength;Decreased range of motion;Decreased activity tolerance;Decreased balance;Decreased mobility;Decreased coordination;Decreased knowledge of use of DME;Cardiopulmonary status limiting activity       PT Treatment Interventions DME instruction;Gait training;Stair training;Functional mobility training;Therapeutic activities;Therapeutic exercise;Balance training;Neuromuscular re-education;Patient/family education    PT Goals (Current goals can be found in the Care Plan section)  Acute Rehab PT Goals Patient Stated Goal: to get home and follow up with strengthening PT Goal Formulation: With patient Time For Goal Achievement: 08/21/20 Potential to Achieve Goals: Good    Frequency Min 3X/week   Barriers to discharge Inaccessible home environment;Decreased caregiver support home alone with stairs to enter    Co-evaluation               AM-PAC PT "6 Clicks" Mobility  Outcome Measure Help needed turning from your back to your side while in a flat bed without using bedrails?: A Little Help needed moving from lying on your back to sitting on the side of a flat bed without using bedrails?: A Little Help needed moving to and from a bed to a chair (including a wheelchair)?: A Little Help needed standing up from a chair using your arms (e.g., wheelchair or bedside chair)?: A Little Help needed to walk in hospital room?: A Little Help needed climbing 3-5 steps with a railing? : A Lot 6 Click Score: 17    End of Session Equipment Utilized During Treatment: Gait belt;Oxygen Activity Tolerance: Patient tolerated treatment well;Patient limited by fatigue Patient left: in bed;with call bell/phone within reach;with bed alarm set Nurse Communication: Mobility  status PT Visit Diagnosis: Unsteadiness on feet (R26.81);Muscle weakness (generalized) (M62.81);Difficulty in walking, not elsewhere classified (R26.2)    Time: 9233-0076 PT Time Calculation (min) (ACUTE ONLY): 24 min   Charges:   PT Evaluation $PT Eval Moderate Complexity: 1 Mod PT Treatments $Gait Training: 8-22 mins       Ramond Dial 08/07/2020, 6:02 PM  Mee Hives, PT MS Acute Rehab Dept. Number: [place] [facility] [place]

## 2020-08-07 NOTE — Progress Notes (Signed)
Patient had episode of nausea and vomiting.  Afterwards stated she felt better, refused medication or ginger ale at this time.

## 2020-08-07 NOTE — Progress Notes (Signed)
PT Cancellation Note  Patient Details Name: Alicia Richardson MRN: [id number] DOB: 07/18/1935   Cancelled Treatment:    Reason Eval/Treat Not Completed: Medical issues which prohibited therapy.  Pt is unable to do PT visit, just vomited and will need to wait a while to stand and move around.  Follow up after lunch to see how pt is progressing.   Ramond Dial 08/07/2020, 1:02 PM   Mee Hives, PT [facility] Acute Rehab Dept. Number: [place] [facility] [place]

## 2020-08-07 NOTE — Progress Notes (Signed)
Pt pulled out her IV twice, stated don't remember doing it. Pt reports living at home alone, no children, no siblings around.

## 2020-08-07 NOTE — Progress Notes (Signed)
  Echocardiogram 2D Echocardiogram has been performed.  Jennette Dubin 08/07/2020, 10:38 AM

## 2020-08-07 NOTE — Progress Notes (Addendum)
PROGRESS NOTE    Alicia Richardson  AOZ:[id number] DOB: 29-Dec-1934 DOA: 08/06/2020 PCP: [name], MD   Brief Narrative: Alicia Richardson is a 85 y.o. female with a history of renal mass s/p nephrectomy, hypertension, vitamin B12 deficiency, SVT s/p ablation, CKD stage III. Patient presented secondary to syncope with concern for orthostatic hypotension.   Assessment & Plan:   Principal Problem:   Syncope Active Problems:   Essential hypertension   Acute renal failure superimposed on stage 3 chronic kidney disease (HCC)   B12 deficiency   Orthostatic hypotension   Syncope Appears to be secondary to orthostatic hypotension. Transthoracic Echocardiogram with mild AS and no other issue to explain syncope. Telemetry without any concerning arrhythmia. -Obtain UDS -Continue Telemetry -PT eval pending  Orthostatic hypotension Confirmed in ED. Presumably secondary to dehydration. -Continue IV fluids -Repeat orthostatic vitals since starting amlodipine  Essential hypertension Patient is prescribed amlodipine and Coreg but does not take any antihypertensives. She also does not regularly follow-up with her primary care physician. Uncontrolled while inpatient. -Restart previously prescribed amlodipine 10 mg daily -Continue Hydralazine prn  AKI on CKD stage IIIa Baseline creatinine of about 1-1.2. Creatinine of 1.89 on admission likely secondary to dehydration. Brother states she has poor oral intake. -Continue IV fluids  History of renal mass s/p right nephrectomy Noted.  B12 deficiency Patient is on vitamin B12 supplementation as an outpatient -Discontinue Vitamin B12 IM  Hypomagnesemia -IV magnesium -Repeat Magnesium in AM  Mild aortic stenosis On Transthoracic Echocardiogram. -Outpatient cardiology follow-up   DVT prophylaxis: Lovenox Code Status:   Code Status: Full Code Family Communication: Brother on telephone (6 minutes) Disposition Plan: Discharge likely in  24 hours pending continued improvement of creatinine with IV fluids in setting of vomiting, PT recommendations for discharge   Consultants:   None  Procedures:   TRANSTHORACIC ECHOCARDIOGRAM (08/07/2020) IMPRESSIONS    1. Left ventricular ejection fraction, by estimation, is 60 to 65%. The  left ventricle has normal function. The left ventricle has no regional  wall motion abnormalities. There is mild left ventricular hypertrophy.  Left ventricular diastolic parameters  are consistent with Grade I diastolic dysfunction (impaired relaxation).  Elevated left atrial pressure.  2. Right ventricular systolic function is normal. The right ventricular  size is normal. There is normal pulmonary artery systolic pressure. The  estimated right ventricular systolic pressure is 96.2 mmHg.  3. Left atrial size was mildly dilated.  4. The mitral valve is normal in structure. No evidence of mitral valve  regurgitation. No evidence of mitral stenosis.  5. The aortic valve is calcified. There is mild calcification of the  aortic valve. There is mild thickening of the aortic valve. Aortic valve  regurgitation is mild. Mild aortic valve stenosis. Aortic valve area, by  VTI measures 1.64 cm. Aortic valve  mean gradient measures 9.0 mmHg. Aortic valve Vmax measures 2.37 m/s.  6. The inferior vena cava is normal in size with greater than 50%  respiratory variability, suggesting right atrial pressure of 3 mmHg.  Antimicrobials:  None    Subjective: No issues today. Called by nurse this afternoon and patient had an episode of nausea with emesis.  Objective: Vitals:   08/06/20 2238 08/07/20 0520 08/07/20 1103 08/07/20 1334  BP: (!) 164/70 (!) 167/80 (!) 153/61 (!) 169/84  Pulse: 65 63 66 67  Resp: 16 18 16 16   Temp: 99 F (37.2 C) 99 F (37.2 C)  97.7 F (36.5 C)  TempSrc: Oral Oral  Oral  SpO2: 97% 96% 96%   Weight: 69.4 kg 69.7 kg    Height: 5\' 2"  (1.575 m)       Intake/Output  Summary (Last 24 hours) at 08/07/2020 1352 Last data filed at 08/07/2020 1104 Gross per 24 hour  Intake 1496.86 ml  Output --  Net 1496.86 ml   Filed Weights   08/06/20 2238 08/07/20 0520  Weight: 69.4 kg 69.7 kg    Examination:  General exam: Appears calm and comfortable Respiratory system: Clear to auscultation. Respiratory effort normal. Cardiovascular system: S1 & S2 heard, RRR. No murmurs, rubs, gallops or clicks. Gastrointestinal system: Abdomen is nondistended, soft and nontender. No organomegaly or masses felt. Normal bowel sounds heard. Central nervous system: Alert and oriented but slow to respond. No focal neurological deficits. Musculoskeletal: No edema. No calf tenderness Skin: No cyanosis. No rashes Psychiatry: Blunt affect. Appears to look confused during interview. Memory appears to be impaired as her memory of her admission is not congruent with H&P report    Data Reviewed: I have personally reviewed following labs and imaging studies  CBC Lab Results  Component Value Date   WBC 6.9 08/07/2020   RBC 4.05 08/07/2020   HGB 11.2 (L) 08/07/2020   HCT 35.7 (L) 08/07/2020   MCV 88.1 08/07/2020   MCH 27.7 08/07/2020   PLT 145 (L) 08/07/2020   MCHC 31.4 08/07/2020   RDW 14.1 08/07/2020   LYMPHSABS 2.1 08/06/2020   MONOABS 0.5 08/06/2020   EOSABS 0.1 08/06/2020   BASOSABS 0.0 62/83/6629     Last metabolic panel Lab Results  Component Value Date   NA 143 08/07/2020   K 3.2 (L) 08/07/2020   CL 110 08/07/2020   CO2 25 08/07/2020   BUN 13 08/07/2020   CREATININE 1.61 (H) 08/07/2020   GLUCOSE 116 (H) 08/07/2020   GFRNONAA 32 (L) 08/07/2020   GFRAA 52 (L) 06/15/2019   CALCIUM 8.1 (L) 08/07/2020   PHOS 3.4 06/07/2019   PROT 6.6 08/06/2020   ALBUMIN 3.5 08/06/2020   BILITOT 1.5 (H) 08/06/2020   ALKPHOS 71 08/06/2020   AST 26 08/06/2020   ALT 18 08/06/2020   ANIONGAP 8 08/07/2020    CBG (last 3)  Recent Labs    08/06/20 1225  GLUCAP 107*      GFR: Estimated Creatinine Clearance: 24.6 mL/min (A) (by C-G formula based on SCr of 1.61 mg/dL (H)).  Coagulation Profile: No results for input(s): INR, PROTIME in the last 168 hours.  Recent Results (from the past 240 hour(s))  Respiratory Panel by RT PCR (Flu A&B, Covid) - Nasopharyngeal Swab     Status: None   Collection Time: 08/06/20  2:05 PM   Specimen: Nasopharyngeal Swab  Result Value Ref Range Status   SARS Coronavirus 2 by RT PCR NEGATIVE NEGATIVE Final    Comment: (NOTE) SARS-CoV-2 target nucleic acids are NOT DETECTED.  The SARS-CoV-2 RNA is generally detectable in upper respiratoy specimens during the acute phase of infection. The lowest concentration of SARS-CoV-2 viral copies this assay can detect is 131 copies/mL. A negative result does not preclude SARS-Cov-2 infection and should not be used as the sole basis for treatment or other patient management decisions. A negative result may occur with  improper specimen collection/handling, submission of specimen other than nasopharyngeal swab, presence of viral mutation(s) within the areas targeted by this assay, and inadequate number of viral copies (<131 copies/mL). A negative result must be combined with clinical observations, patient history, and epidemiological information. The  expected result is Negative.  Fact Sheet for Patients:  PinkCheek.be  Fact Sheet for Healthcare Providers:  GravelBags.it  This test is no t yet approved or cleared by the [place] FDA and  has been authorized for detection and/or diagnosis of SARS-CoV-2 by FDA under an Emergency Use Authorization (EUA). This EUA will remain  in effect (meaning this test can be used) for the duration of the COVID-19 declaration under Section 564(b)(1) of the Act, 21 U.S.C. section 360bbb-3(b)(1), unless the authorization is terminated or revoked sooner.     Influenza A by PCR NEGATIVE  NEGATIVE Final   Influenza B by PCR NEGATIVE NEGATIVE Final    Comment: (NOTE) The Xpert Xpress SARS-CoV-2/FLU/RSV assay is intended as an aid in  the diagnosis of influenza from Nasopharyngeal swab specimens and  should not be used as a sole basis for treatment. Nasal washings and  aspirates are unacceptable for Xpert Xpress SARS-CoV-2/FLU/RSV  testing.  Fact Sheet for Patients: PinkCheek.be  Fact Sheet for Healthcare Providers: GravelBags.it  This test is not yet approved or cleared by the [place] FDA and  has been authorized for detection and/or diagnosis of SARS-CoV-2 by  FDA under an Emergency Use Authorization (EUA). This EUA will remain  in effect (meaning this test can be used) for the duration of the  Covid-19 declaration under Section 564(b)(1) of the Act, 21  U.S.C. section 360bbb-3(b)(1), unless the authorization is  terminated or revoked. Performed at [facility], [place] [address], [place], [place] [address]         Radiology Studies: DG Chest 2 View  Result Date: 08/06/2020 CLINICAL DATA:  Syncope for 1 day. EXAM: CHEST - 2 VIEW COMPARISON:  One-view chest x-ray 06/06/2019 FINDINGS: The heart is enlarged. No edema or effusion is present. No focal airspace disease is evident. Degenerative changes in the thoracic spine are stable. IMPRESSION: 1. Stable cardiomegaly without failure. 2. No acute cardiopulmonary disease. Electronically Signed   By: San Morelle M.D.   On: 08/06/2020 13:24   CT HEAD WO CONTRAST  Result Date: 08/06/2020 CLINICAL DATA:  Altered mental status. EXAM: CT HEAD WITHOUT CONTRAST TECHNIQUE: Contiguous axial images were obtained from the base of the skull through the vertex without intravenous contrast. COMPARISON:  06/06/2019. FINDINGS: Brain: No evidence of acute large vascular territory infarction, hemorrhage, hydrocephalus, extra-axial collection or mass lesion/mass  effect. Similar patchy white matter hypoattenuation, compatible with chronic microvascular ischemic disease. Similar small remote lacunar infarcts in bilateral basal ganglia. Similar generalized cerebral volume loss with ex vacuo ventricular dilation. Vascular: Calcific atherosclerosis Skull: No acute fracture.  Hyperostosis frontalis. Sinuses/Orbits: No acute findings. Other: No mastoid effusions. IMPRESSION: 1. No evidence of acute intracranial abnormality. 2. Similar chronic microvascular ischemic disease, generalized atrophy and remote lacunar infarcts in bilateral basal ganglia. Electronically Signed   By: Margaretha Sheffield MD   On: 08/06/2020 13:49   ECHOCARDIOGRAM COMPLETE  Result Date: 08/07/2020    ECHOCARDIOGRAM REPORT   Patient Name:   Alicia Richardson Date of Exam: 08/07/2020 Medical Rec #:  [id number]      Height:       62.0 in Accession #:    [id number]     Weight:       153.7 lb Date of Birth:  11-11-1934      BSA:          1.709 m Patient Age:    73 years       BP:  167/80 mmHg Patient Gender: F              HR:           63 bpm. Exam Location:  Inpatient Procedure: 2D Echo Indications:    Syncope R55  History:        Patient has no prior history of Echocardiogram examinations.                 Risk Factors:Hypertension.  Sonographer:    Mikki Santee RDCS (AE) Referring Phys: [place]  1. Left ventricular ejection fraction, by estimation, is 60 to 65%. The left ventricle has normal function. The left ventricle has no regional wall motion abnormalities. There is mild left ventricular hypertrophy. Left ventricular diastolic parameters are consistent with Grade I diastolic dysfunction (impaired relaxation). Elevated left atrial pressure.  2. Right ventricular systolic function is normal. The right ventricular size is normal. There is normal pulmonary artery systolic pressure. The estimated right ventricular systolic pressure is 94.7 mmHg.  3. Left atrial size was  mildly dilated.  4. The mitral valve is normal in structure. No evidence of mitral valve regurgitation. No evidence of mitral stenosis.  5. The aortic valve is calcified. There is mild calcification of the aortic valve. There is mild thickening of the aortic valve. Aortic valve regurgitation is mild. Mild aortic valve stenosis. Aortic valve area, by VTI measures 1.64 cm. Aortic valve mean gradient measures 9.0 mmHg. Aortic valve Vmax measures 2.37 m/s.  6. The inferior vena cava is normal in size with greater than 50% respiratory variability, suggesting right atrial pressure of 3 mmHg. FINDINGS  Left Ventricle: Left ventricular ejection fraction, by estimation, is 60 to 65%. The left ventricle has normal function. The left ventricle has no regional wall motion abnormalities. The left ventricular internal cavity size was normal in size. There is  mild left ventricular hypertrophy. Left ventricular diastolic parameters are consistent with Grade I diastolic dysfunction (impaired relaxation). Elevated left atrial pressure. Right Ventricle: The right ventricular size is normal. No increase in right ventricular wall thickness. Right ventricular systolic function is normal. There is normal pulmonary artery systolic pressure. The tricuspid regurgitant velocity is 2.47 m/s, and  with an assumed right atrial pressure of 3 mmHg, the estimated right ventricular systolic pressure is 09.6 mmHg. Left Atrium: Left atrial size was mildly dilated. Right Atrium: Right atrial size was normal in size. Pericardium: There is no evidence of pericardial effusion. Mitral Valve: The mitral valve is normal in structure. No evidence of mitral valve regurgitation. No evidence of mitral valve stenosis. Tricuspid Valve: The tricuspid valve is normal in structure. Tricuspid valve regurgitation is mild . No evidence of tricuspid stenosis. Aortic Valve: The aortic valve is calcified. There is mild calcification of the aortic valve. There is mild  thickening of the aortic valve. Aortic valve regurgitation is mild. Aortic regurgitation PHT measures 476 msec. Mild aortic stenosis is present. Aortic valve mean gradient measures 9.0 mmHg. Aortic valve peak gradient measures 22.5 mmHg. Aortic valve area, by VTI measures 1.64 cm. Pulmonic Valve: The pulmonic valve was normal in structure. Pulmonic valve regurgitation is mild. No evidence of pulmonic stenosis. Aorta: The aortic root is normal in size and structure. Venous: The inferior vena cava is normal in size with greater than 50% respiratory variability, suggesting right atrial pressure of 3 mmHg. IAS/Shunts: No atrial level shunt detected by color flow Doppler.  LEFT VENTRICLE PLAX 2D LVIDd:  4.40 cm  Diastology LVIDs:         2.60 cm  LV e' medial:    3.07 cm/s LV PW:         1.20 cm  LV E/e' medial:  34.9 LV IVS:        1.30 cm  LV e' lateral:   4.64 cm/s LVOT diam:     2.20 cm  LV E/e' lateral: 23.1 LV SV:         87 LV SV Index:   51 LVOT Area:     3.80 cm  RIGHT VENTRICLE RV S prime:     12.10 cm/s TAPSE (M-mode): 1.9 cm LEFT ATRIUM             Index       RIGHT ATRIUM           Index LA diam:        3.00 cm 1.76 cm/m  RA Area:     15.20 cm LA Vol (A2C):   72.0 ml 42.12 ml/m RA Volume:   33.20 ml  19.42 ml/m LA Vol (A4C):   53.5 ml 31.30 ml/m LA Biplane Vol: 64.9 ml 37.97 ml/m  AORTIC VALVE AV Area (Vmax):    1.80 cm AV Area (Vmean):   2.09 cm AV Area (VTI):     1.64 cm AV Vmax:           237.00 cm/s AV Vmean:          137.000 cm/s AV VTI:            0.528 m AV Peak Grad:      22.5 mmHg AV Mean Grad:      9.0 mmHg LVOT Vmax:         112.00 cm/s LVOT Vmean:        75.500 cm/s LVOT VTI:          0.228 m LVOT/AV VTI ratio: 0.43 AI PHT:            476 msec  AORTA Ao Root diam: 3.10 cm MITRAL VALVE                TRICUSPID VALVE MV Area (PHT): 2.80 cm     TR Peak grad:   24.4 mmHg MV Decel Time: 271 msec     TR Vmax:        247.00 cm/s MV E velocity: 107.00 cm/s MV A velocity: 111.00 cm/s   SHUNTS MV E/A ratio:  0.96         Systemic VTI:  0.23 m                             Systemic Diam: 2.20 cm Candee Furbish MD Electronically signed by Candee Furbish MD Signature Date/Time: 08/07/2020/1:28:04 PM    Final         Scheduled Meds: . amLODipine  10 mg Oral Daily  . cyanocobalamin  1,000 mcg Subcutaneous Daily  . enoxaparin (LOVENOX) injection  30 mg Subcutaneous Daily  . [START ON 08/08/2020] influenza vaccine adjuvanted  0.5 mL Intramuscular Tomorrow-1000  . sodium chloride flush  3 mL Intravenous Q12H   Continuous Infusions: . sodium chloride 100 mL/hr at 08/07/20 0526     LOS: 0 days     Cordelia Poche, MD [facility] 08/07/2020, 1:52 PM  If 7PM-7AM, please contact night-coverage [email or web address]

## 2020-08-08 DIAGNOSIS — N179 Acute kidney failure, unspecified: Secondary | ICD-10-CM | POA: Diagnosis present

## 2020-08-08 DIAGNOSIS — Z85528 Personal history of other malignant neoplasm of kidney: Secondary | ICD-10-CM | POA: Diagnosis not present

## 2020-08-08 DIAGNOSIS — R410 Disorientation, unspecified: Secondary | ICD-10-CM

## 2020-08-08 DIAGNOSIS — I350 Nonrheumatic aortic (valve) stenosis: Secondary | ICD-10-CM | POA: Diagnosis present

## 2020-08-08 DIAGNOSIS — F05 Delirium due to known physiological condition: Secondary | ICD-10-CM | POA: Diagnosis present

## 2020-08-08 DIAGNOSIS — I10 Essential (primary) hypertension: Secondary | ICD-10-CM | POA: Diagnosis not present

## 2020-08-08 DIAGNOSIS — F0390 Unspecified dementia without behavioral disturbance: Secondary | ICD-10-CM | POA: Diagnosis present

## 2020-08-08 DIAGNOSIS — I951 Orthostatic hypotension: Secondary | ICD-10-CM | POA: Diagnosis present

## 2020-08-08 DIAGNOSIS — E538 Deficiency of other specified B group vitamins: Secondary | ICD-10-CM | POA: Diagnosis present

## 2020-08-08 DIAGNOSIS — Z20822 Contact with and (suspected) exposure to covid-19: Secondary | ICD-10-CM | POA: Diagnosis present

## 2020-08-08 DIAGNOSIS — R55 Syncope and collapse: Secondary | ICD-10-CM | POA: Diagnosis present

## 2020-08-08 DIAGNOSIS — Z905 Acquired absence of kidney: Secondary | ICD-10-CM | POA: Diagnosis not present

## 2020-08-08 DIAGNOSIS — E876 Hypokalemia: Secondary | ICD-10-CM | POA: Diagnosis present

## 2020-08-08 DIAGNOSIS — R0902 Hypoxemia: Secondary | ICD-10-CM | POA: Diagnosis present

## 2020-08-08 DIAGNOSIS — Z23 Encounter for immunization: Secondary | ICD-10-CM | POA: Diagnosis not present

## 2020-08-08 DIAGNOSIS — I129 Hypertensive chronic kidney disease with stage 1 through stage 4 chronic kidney disease, or unspecified chronic kidney disease: Secondary | ICD-10-CM | POA: Diagnosis present

## 2020-08-08 DIAGNOSIS — E860 Dehydration: Secondary | ICD-10-CM | POA: Diagnosis present

## 2020-08-08 DIAGNOSIS — N1832 Chronic kidney disease, stage 3b: Secondary | ICD-10-CM | POA: Diagnosis present

## 2020-08-08 DIAGNOSIS — R4189 Other symptoms and signs involving cognitive functions and awareness: Secondary | ICD-10-CM

## 2020-08-08 LAB — BASIC METABOLIC PANEL
Anion gap: 8 (ref 5–15)
BUN: 7 mg/dL — ABNORMAL LOW (ref 8–23)
CO2: 25 mmol/L (ref 22–32)
Calcium: 9 mg/dL (ref 8.9–10.3)
Chloride: 112 mmol/L — ABNORMAL HIGH (ref 98–111)
Creatinine, Ser: 1.4 mg/dL — ABNORMAL HIGH (ref 0.44–1.00)
GFR, Estimated: 38 mL/min — ABNORMAL LOW (ref >60)
Glucose, Bld: 104 mg/dL — ABNORMAL HIGH (ref 70–99)
Potassium: 3.1 mmol/L — ABNORMAL LOW (ref 3.5–5.1)
Sodium: 145 mmol/L (ref 135–145)

## 2020-08-08 LAB — RAPID URINE DRUG SCREEN, HOSP PERFORMED
Amphetamines: NOT DETECTED
Barbiturates: NOT DETECTED
Benzodiazepines: NOT DETECTED
Cocaine: NOT DETECTED
Opiates: NOT DETECTED
Tetrahydrocannabinol: NOT DETECTED

## 2020-08-08 LAB — MAGNESIUM: Magnesium: 1.9 mg/dL (ref 1.7–2.4)

## 2020-08-08 LAB — URINALYSIS, ROUTINE W REFLEX MICROSCOPIC
Bacteria, UA: NONE SEEN
Bacteria, UA: NONE SEEN
Bilirubin Urine: NEGATIVE
Bilirubin Urine: NEGATIVE
Glucose, UA: NEGATIVE mg/dL
Glucose, UA: NEGATIVE mg/dL
Ketones, ur: 5 mg/dL — AB
Ketones, ur: NEGATIVE mg/dL
Nitrite: NEGATIVE
Nitrite: NEGATIVE
Protein, ur: NEGATIVE mg/dL
Protein, ur: NEGATIVE mg/dL
Specific Gravity, Urine: 1.008 (ref 1.005–1.030)
Specific Gravity, Urine: 1.008 (ref 1.005–1.030)
pH: 6 (ref 5.0–8.0)
pH: 6 (ref 5.0–8.0)

## 2020-08-08 MED ORDER — POTASSIUM CHLORIDE CRYS ER 20 MEQ PO TBCR: 40.0000 meq | EXTENDED_RELEASE_TABLET | ORAL | Status: AC

## 2020-08-08 MED ORDER — LORAZEPAM 2 MG/ML IJ SOLN
0.5000 mg | Freq: Once | INTRAMUSCULAR | Status: AC | PRN
  Administered 2020-08-08: 0.5 mg via INTRAVENOUS
  Filled 2020-08-08: qty 1

## 2020-08-08 NOTE — Progress Notes (Signed)
Patient refusing morning blood sugar and IV fluids. Pulling at IV. For the moment, paused Normal saline. Reorienting patient.

## 2020-08-08 NOTE — Progress Notes (Signed)
Patient confused all night, pulling at lines, tele monitor and trying to leave hospital. Patient only alert to self. Paged [facility], got orders for safety sitter and prn ativan. Will continue to reorient patient.

## 2020-08-08 NOTE — Progress Notes (Signed)
PROGRESS NOTE    Alicia Alicia  YIF:[id number] DOB: [birth year] DOA: 08/06/2020 PCP: [name], MD   Brief Narrative: Alicia Alicia is a 85 y.o. female with a history of renal mass s/p nephrectomy, hypertension, vitamin B12 deficiency, SVT s/p ablation, CKD stage III. Patient presented secondary to syncope with concern for orthostatic hypotension.   Assessment & Plan:   Principal Problem:   Syncope Active Problems:   Essential hypertension   Acute renal failure superimposed on stage 3 chronic kidney disease (HCC)   B12 deficiency   Orthostatic hypotension   Syncope Appears to be secondary to orthostatic hypotension. Transthoracic Echocardiogram with mild AS and no other issue to explain syncope. Telemetry without any concerning arrhythmia. PT recommending HH with supervision (she, unfortunately lives alone). UDS negative.  Orthostatic hypotension Confirmed in ED. Presumably secondary to dehydration. Plan was to continue IV fluids but patient has removed multiple IVs.  Essential hypertension Patient is prescribed amlodipine and Coreg but does not take any antihypertensives. She also does not regularly follow-up with her primary care physician. Uncontrolled while inpatient. -Continue amlodipine 10 mg daily -Continue Hydralazine prn  Confusion/Delirium Cognitive impairment CT scan significant for cerebral atrophy. Patient with episodes of delirium in the hospital with hallucinations and what appear to be delusions. Discussed with her brother, Alicia Alicia, and he states there is no known history of her sister(s) having problems with hallucinations and highly doubts her rent is $3000. -Psychiatry consult for apparent psychotic features -Patient does not have capacity at this time to make decisions for herself with regard to medical care/safety -SLP consult for cognitive evaluation  AKI on CKD stage IIIa Baseline creatinine of about 1-1.2. Creatinine of 1.89 on admission likely  secondary to dehydration. Brother states she has poor oral intake. No recurrent vomiting overnight and patient has pulled out IV lines. Creatinine has trended down. -Encourage good PO intake  History of renal mass s/p right nephrectomy Noted.  B12 deficiency Patient is on vitamin B12 supplementation as an outpatient -Discontinue Vitamin B12 IM  Hypomagnesemia Resolved with repletion.  Hypokalemia Replete.  Mild aortic valve stenosis On Transthoracic Echocardiogram. -Outpatient cardiology follow-up   DVT prophylaxis: Lovenox Code Status:   Code Status: Full Code Family Communication: Brother on telephone Disposition Plan: Discharge pending psychiatry recommendations in addition to safe discharge plan   Consultants:   Psychiatry  Procedures:   TRANSTHORACIC ECHOCARDIOGRAM (08/07/2020) IMPRESSIONS    1. Left ventricular ejection fraction, by estimation, is 60 to 65%. The  left ventricle has normal function. The left ventricle has no regional  wall motion abnormalities. There is mild left ventricular hypertrophy.  Left ventricular diastolic parameters  are consistent with Grade I diastolic dysfunction (impaired relaxation).  Elevated left atrial pressure.  2. Right ventricular systolic function is normal. The right ventricular  size is normal. There is normal pulmonary artery systolic pressure. The  estimated right ventricular systolic pressure is 86.7 mmHg.  3. Left atrial size was mildly dilated.  4. The mitral valve is normal in structure. No evidence of mitral valve  regurgitation. No evidence of mitral stenosis.  5. The aortic valve is calcified. There is mild calcification of the  aortic valve. There is mild thickening of the aortic valve. Aortic valve  regurgitation is mild. Mild aortic valve stenosis. Aortic valve area, by  VTI measures 1.64 cm. Aortic valve  mean gradient measures 9.0 mmHg. Aortic valve Vmax measures 2.37 m/s.  6. The inferior vena  cava is normal in size with  greater than 50%  respiratory variability, suggesting right atrial pressure of 3 mmHg.  Antimicrobials:  None    Subjective: No issues today. Called by nurse this afternoon and patient had an episode of nausea with emesis.  Objective: Vitals:   08/08/20 0422 08/08/20 0952 08/08/20 0952 08/08/20 1127  BP: (!) 183/74 (!) 165/73 (!) 203/97 (!) 165/73  Pulse: 79 75 72 75  Resp: 18 16 16 16   Temp: 98.4 F (36.9 C) 97.9 F (36.6 C) 97.9 F (36.6 C)   TempSrc: Oral  Oral   SpO2: 97%  99%   Weight:      Height:        Intake/Output Summary (Last 24 hours) at 08/08/2020 1256 Last data filed at 08/08/2020 1000 Gross per 24 hour  Intake --  Output 0 ml  Net 0 ml   Filed Weights   08/06/20 2238 08/07/20 0520  Weight: 69.4 kg 69.7 kg    Examination:  General exam: Appears calm and comfortable Respiratory system: Clear to auscultation. Respiratory effort normal. Cardiovascular system: S1 & S2 heard, RRR. No murmurs, rubs, gallops or clicks. Gastrointestinal system: Abdomen is nondistended, soft and nontender. No organomegaly or masses felt. Normal bowel sounds heard. Central nervous system: Alert and oriented but slow to respond. No focal neurological deficits. Musculoskeletal: No edema. No calf tenderness Skin: No cyanosis. No rashes Psychiatry: Blunt affect. Appears to look confused during interview. Memory appears to be impaired as her memory of her admission is not congruent with H&P report    Data Reviewed: I have personally reviewed following labs and imaging studies  CBC Lab Results  Component Value Date   WBC 6.9 08/07/2020   RBC 4.05 08/07/2020   HGB 11.2 (L) 08/07/2020   HCT 35.7 (L) 08/07/2020   MCV 88.1 08/07/2020   MCH 27.7 08/07/2020   PLT 145 (L) 08/07/2020   MCHC 31.4 08/07/2020   RDW 14.1 08/07/2020   LYMPHSABS 2.1 08/06/2020   MONOABS 0.5 08/06/2020   EOSABS 0.1 08/06/2020   BASOSABS 0.0 08/06/2020     Last  metabolic panel Lab Results  Component Value Date   NA 145 08/08/2020   K 3.1 (L) 08/08/2020   CL 112 (H) 08/08/2020   CO2 25 08/08/2020   BUN 7 (L) 08/08/2020   CREATININE 1.40 (H) 08/08/2020   GLUCOSE 104 (H) 08/08/2020   GFRNONAA 38 (L) 08/08/2020   GFRAA 52 (L) 06/15/2019   CALCIUM 9.0 08/08/2020   PHOS 3.4 06/07/2019   PROT 6.6 08/06/2020   ALBUMIN 3.5 08/06/2020   BILITOT 1.5 (H) 08/06/2020   ALKPHOS 71 08/06/2020   AST 26 08/06/2020   ALT 18 08/06/2020   ANIONGAP 8 08/08/2020    CBG (last 3)  Recent Labs    08/06/20 1225  GLUCAP 107*     GFR: Estimated Creatinine Clearance: 28.3 mL/min (A) (by C-G formula based on SCr of 1.4 mg/dL (H)).  Coagulation Profile: No results for input(s): INR, PROTIME in the last 168 hours.  Recent Results (from the past 240 hour(s))  Respiratory Panel by RT PCR (Flu A&B, Covid) - Nasopharyngeal Swab     Status: None   Collection Time: 08/06/20  2:05 PM   Specimen: Nasopharyngeal Swab  Result Value Ref Range Status   SARS Coronavirus 2 by RT PCR NEGATIVE NEGATIVE Final    Comment: (NOTE) SARS-CoV-2 target nucleic acids are NOT DETECTED.  The SARS-CoV-2 RNA is generally detectable in upper respiratoy specimens during the acute phase of infection. The  lowest concentration of SARS-CoV-2 viral copies this assay can detect is 131 copies/mL. A negative result does not preclude SARS-Cov-2 infection and should not be used as the sole basis for treatment or other patient management decisions. A negative result may occur with  improper specimen collection/handling, submission of specimen other than nasopharyngeal swab, presence of viral mutation(s) within the areas targeted by this assay, and inadequate number of viral copies (<131 copies/mL). A negative result must be combined with clinical observations, patient history, and epidemiological information. The expected result is Negative.  Fact Sheet for Patients:    PinkCheek.be  Fact Sheet for Healthcare Providers:  GravelBags.it  This test is no t yet approved or cleared by the [place] FDA and  has been authorized for detection and/or diagnosis of SARS-CoV-2 by FDA under an Emergency Use Authorization (EUA). This EUA will remain  in effect (meaning this test can be used) for the duration of the COVID-19 declaration under Section 564(b)(1) of the Act, 21 U.S.C. section 360bbb-3(b)(1), unless the authorization is terminated or revoked sooner.     Influenza A by PCR NEGATIVE NEGATIVE Final   Influenza B by PCR NEGATIVE NEGATIVE Final    Comment: (NOTE) The Xpert Xpress SARS-CoV-2/FLU/RSV assay is intended as an aid in  the diagnosis of influenza from Nasopharyngeal swab specimens and  should not be used as a sole basis for treatment. Nasal washings and  aspirates are unacceptable for Xpert Xpress SARS-CoV-2/FLU/RSV  testing.  Fact Sheet for Patients: PinkCheek.be  Fact Sheet for Healthcare Providers: GravelBags.it  This test is not yet approved or cleared by the [place] FDA and  has been authorized for detection and/or diagnosis of SARS-CoV-2 by  FDA under an Emergency Use Authorization (EUA). This EUA will remain  in effect (meaning this test can be used) for the duration of the  Covid-19 declaration under Section 564(b)(1) of the Act, 21  U.S.C. section 360bbb-3(b)(1), unless the authorization is  terminated or revoked. Performed at [facility], [place] [address] [place], [place] [address]         Radiology Studies: DG Chest 2 View  Result Date: 08/06/2020 CLINICAL DATA:  Syncope for 1 day. EXAM: CHEST - 2 VIEW COMPARISON:  One-view chest x-ray 06/06/2019 FINDINGS: The heart is enlarged. No edema or effusion is present. No focal airspace disease is evident. Degenerative changes in the thoracic spine  are stable. IMPRESSION: 1. Stable cardiomegaly without failure. 2. No acute cardiopulmonary disease. Electronically Signed   By: San Morelle M.D.   On: 08/06/2020 13:24   CT HEAD WO CONTRAST  Result Date: 08/06/2020 CLINICAL DATA:  Altered mental status. EXAM: CT HEAD WITHOUT CONTRAST TECHNIQUE: Contiguous axial images were obtained from the base of the skull through the vertex without intravenous contrast. COMPARISON:  06/06/2019. FINDINGS: Brain: No evidence of acute large vascular territory infarction, hemorrhage, hydrocephalus, extra-axial collection or mass lesion/mass effect. Similar patchy white matter hypoattenuation, compatible with chronic microvascular ischemic disease. Similar small remote lacunar infarcts in bilateral basal ganglia. Similar generalized cerebral volume loss with ex vacuo ventricular dilation. Vascular: Calcific atherosclerosis Skull: No acute fracture.  Hyperostosis frontalis. Sinuses/Orbits: No acute findings. Other: No mastoid effusions. IMPRESSION: 1. No evidence of acute intracranial abnormality. 2. Similar chronic microvascular ischemic disease, generalized atrophy and remote lacunar infarcts in bilateral basal ganglia. Electronically Signed   By: Margaretha Sheffield MD   On: 08/06/2020 13:49   ECHOCARDIOGRAM COMPLETE  Result Date: 08/07/2020    ECHOCARDIOGRAM REPORT   Patient  Name:   MACY POLIO Date of Exam: 08/07/2020 Medical Rec #:  [id number]      Height:       62.0 in Accession #:    [id number]     Weight:       153.7 lb Date of Birth:  10-06-1934      BSA:          1.709 m Patient Age:    72 years       BP:           167/80 mmHg Patient Gender: F              HR:           63 bpm. Exam Location:  Inpatient Procedure: 2D Echo Indications:    Syncope R55  History:        Patient has no prior history of Echocardiogram examinations.                 Risk Factors:Hypertension.  Sonographer:    Mikki Santee RDCS (AE) Referring Phys: [place]  1. Left ventricular ejection fraction, by estimation, is 60 to 65%. The left ventricle has normal function. The left ventricle has no regional wall motion abnormalities. There is mild left ventricular hypertrophy. Left ventricular diastolic parameters are consistent with Grade I diastolic dysfunction (impaired relaxation). Elevated left atrial pressure.  2. Right ventricular systolic function is normal. The right ventricular size is normal. There is normal pulmonary artery systolic pressure. The estimated right ventricular systolic pressure is 75.6 mmHg.  3. Left atrial size was mildly dilated.  4. The mitral valve is normal in structure. No evidence of mitral valve regurgitation. No evidence of mitral stenosis.  5. The aortic valve is calcified. There is mild calcification of the aortic valve. There is mild thickening of the aortic valve. Aortic valve regurgitation is mild. Mild aortic valve stenosis. Aortic valve area, by VTI measures 1.64 cm. Aortic valve mean gradient measures 9.0 mmHg. Aortic valve Vmax measures 2.37 m/s.  6. The inferior vena cava is normal in size with greater than 50% respiratory variability, suggesting right atrial pressure of 3 mmHg. FINDINGS  Left Ventricle: Left ventricular ejection fraction, by estimation, is 60 to 65%. The left ventricle has normal function. The left ventricle has no regional wall motion abnormalities. The left ventricular internal cavity size was normal in size. There is  mild left ventricular hypertrophy. Left ventricular diastolic parameters are consistent with Grade I diastolic dysfunction (impaired relaxation). Elevated left atrial pressure. Right Ventricle: The right ventricular size is normal. No increase in right ventricular wall thickness. Right ventricular systolic function is normal. There is normal pulmonary artery systolic pressure. The tricuspid regurgitant velocity is 2.47 m/s, and  with an assumed right atrial pressure of 3 mmHg, the  estimated right ventricular systolic pressure is 43.3 mmHg. Left Atrium: Left atrial size was mildly dilated. Right Atrium: Right atrial size was normal in size. Pericardium: There is no evidence of pericardial effusion. Mitral Valve: The mitral valve is normal in structure. No evidence of mitral valve regurgitation. No evidence of mitral valve stenosis. Tricuspid Valve: The tricuspid valve is normal in structure. Tricuspid valve regurgitation is mild . No evidence of tricuspid stenosis. Aortic Valve: The aortic valve is calcified. There is mild calcification of the aortic valve. There is mild thickening of the aortic valve. Aortic valve regurgitation is mild. Aortic regurgitation PHT measures 476 msec. Mild aortic stenosis is present. Aortic valve mean  gradient measures 9.0 mmHg. Aortic valve peak gradient measures 22.5 mmHg. Aortic valve area, by VTI measures 1.64 cm. Pulmonic Valve: The pulmonic valve was normal in structure. Pulmonic valve regurgitation is mild. No evidence of pulmonic stenosis. Aorta: The aortic root is normal in size and structure. Venous: The inferior vena cava is normal in size with greater than 50% respiratory variability, suggesting right atrial pressure of 3 mmHg. IAS/Shunts: No atrial level shunt detected by color flow Doppler.  LEFT VENTRICLE PLAX 2D LVIDd:         4.40 cm  Diastology LVIDs:         2.60 cm  LV e' medial:    3.07 cm/s LV PW:         1.20 cm  LV E/e' medial:  34.9 LV IVS:        1.30 cm  LV e' lateral:   4.64 cm/s LVOT diam:     2.20 cm  LV E/e' lateral: 23.1 LV SV:         87 LV SV Index:   51 LVOT Area:     3.80 cm  RIGHT VENTRICLE RV S prime:     12.10 cm/s TAPSE (M-mode): 1.9 cm LEFT ATRIUM             Index       RIGHT ATRIUM           Index LA diam:        3.00 cm 1.76 cm/m  RA Area:     15.20 cm LA Vol (A2C):   72.0 ml 42.12 ml/m RA Volume:   33.20 ml  19.42 ml/m LA Vol (A4C):   53.5 ml 31.30 ml/m LA Biplane Vol: 64.9 ml 37.97 ml/m  AORTIC VALVE AV Area  (Vmax):    1.80 cm AV Area (Vmean):   2.09 cm AV Area (VTI):     1.64 cm AV Vmax:           237.00 cm/s AV Vmean:          137.000 cm/s AV VTI:            0.528 m AV Peak Grad:      22.5 mmHg AV Mean Grad:      9.0 mmHg LVOT Vmax:         112.00 cm/s LVOT Vmean:        75.500 cm/s LVOT VTI:          0.228 m LVOT/AV VTI ratio: 0.43 AI PHT:            476 msec  AORTA Ao Root diam: 3.10 cm MITRAL VALVE                TRICUSPID VALVE MV Area (PHT): 2.80 cm     TR Peak grad:   24.4 mmHg MV Decel Time: 271 msec     TR Vmax:        247.00 cm/s MV E velocity: 107.00 cm/s MV A velocity: 111.00 cm/s  SHUNTS MV E/A ratio:  0.96         Systemic VTI:  0.23 m                             Systemic Diam: 2.20 cm Candee Furbish MD Electronically signed by Candee Furbish MD Signature Date/Time: 08/07/2020/1:28:04 PM    Final         Scheduled Meds: . amLODipine  10 mg  Oral Daily  . enoxaparin (LOVENOX) injection  30 mg Subcutaneous Daily  . influenza vaccine adjuvanted  0.5 mL Intramuscular Tomorrow-1000  . sodium chloride flush  3 mL Intravenous Q12H   Continuous Infusions: . sodium chloride Stopped (08/08/20 0525)     LOS: 0 days     Cordelia Poche, MD [facility] 08/08/2020, 12:56 PM  If 7PM-7AM, please contact night-coverage [email or web address]

## 2020-08-08 NOTE — Progress Notes (Signed)
°   08/08/20 0830  What Happened  Was fall witnessed? Yes  Who witnessed fall? Vivi, NT  Patients activity before fall ambulating-unassisted  Point of contact buttocks  Was patient injured? No  Follow Up  MD notified Yes  Time MD notified 66  Family notified Yes - comment (MD notified)  Time family notified  (MD notified family post fall)  Additional tests No  Simple treatment Other (comment) (Assessed)  Progress note created (see row info) Yes  Adult Fall Risk Assessment  Risk Factor Category (scoring not indicated) High fall risk per protocol (document High fall risk)  Patient Fall Risk Level High fall risk  Adult Fall Risk Interventions  Required Bundle Interventions *See Row Information* High fall risk - low, moderate, and high requirements implemented  Additional Interventions Use of appropriate toileting equipment (bedpan, BSC, etc.)  Screening for Fall Injury Risk (To be completed on HIGH fall risk patients) - Assessing Need for Low Bed  Risk For Fall Injury- Low Bed Criteria Previous fall this admission  Will Implement Low Bed and Floor Mats Yes  Screening for Fall Injury Risk (To be completed on HIGH fall risk patients who do not meet crieteria for Low Bed) - Assessing Need for Floor Mats Only  Risk For Fall Injury- Criteria for Floor Mats None identified - No additional interventions needed  Vitals  Temp  (PT refused)  Temp Source  (Pt refused)  BP  (Pt refused)  MAP (mmHg)  (Pt refused)  Pulse Rate  (Pt refused)  Resp 16  Oxygen Therapy  SpO2 98 %  O2 Device Room Air  Pain Assessment  Pain Scale 0-10  Pain Score 0

## 2020-08-09 LAB — BASIC METABOLIC PANEL
Anion gap: 7 (ref 5–15)
BUN: 11 mg/dL (ref 8–23)
CO2: 26 mmol/L (ref 22–32)
Calcium: 8.4 mg/dL — ABNORMAL LOW (ref 8.9–10.3)
Chloride: 110 mmol/L (ref 98–111)
Creatinine, Ser: 1.46 mg/dL — ABNORMAL HIGH (ref 0.44–1.00)
GFR, Estimated: 36 mL/min — ABNORMAL LOW (ref >60)
Glucose, Bld: 106 mg/dL — ABNORMAL HIGH (ref 70–99)
Potassium: 3 mmol/L — ABNORMAL LOW (ref 3.5–5.1)
Sodium: 143 mmol/L (ref 135–145)

## 2020-08-09 LAB — URINE CULTURE: Culture: NO GROWTH

## 2020-08-09 NOTE — Progress Notes (Signed)
PROGRESS NOTE    [name]  HYI:[id number] DOB: [birth year] DOA: 08/06/2020 PCP: Alicia Shanks, MD   Brief Narrative: Alicia Richardson is a 85 y.o. female with a history of renal mass s/p nephrectomy, hypertension, vitamin B12 deficiency, SVT s/p ablation, CKD stage III. Patient presented secondary to syncope with concern for orthostatic hypotension.   Assessment & Plan:   Principal Problem:   Syncope Active Problems:   Essential hypertension   Acute renal failure superimposed on stage 3 chronic kidney disease (HCC)   B12 deficiency   Orthostatic hypotension   Syncope Appears to be secondary to orthostatic hypotension. Transthoracic Echocardiogram with mild AS and no other issue to explain syncope. Telemetry without any concerning arrhythmia. PT recommending HH with supervision (she, unfortunately lives alone). UDS negative.  Orthostatic hypotension Confirmed in ED. Presumably secondary to dehydration. Plan was to continue IV fluids but patient has removed multiple IVs.  Essential hypertension Patient is prescribed amlodipine and Coreg but does not take any antihypertensives. She also does not regularly follow-up with her primary care physician. Uncontrolled while inpatient. -Continue amlodipine 10 mg daily -Continue Hydralazine prn  Confusion/Delirium Cognitive impairment CT scan significant for cerebral atrophy. Patient with episodes of delirium in the hospital with hallucinations and what appear to be delusions. Patient symptoms improved and are likely mostly consistent with dementia with intermittent delirium. On evaluation, she appears to have capacity today but suspect this wanes. Patient would most likely benefit from ALF. -CSW recommendations for discharge planning  AKI on CKD stage IIIa Baseline creatinine of about 1-1.2. Creatinine of 1.89 on admission likely secondary to dehydration. Brother states she has poor oral intake. No recurrent vomiting overnight and  patient has pulled out IV lines. Creatinine has trended down from admission but stabilized -Encourage good PO intake -Restart IV fluids  History of renal mass s/p right nephrectomy Noted.  B12 deficiency Patient is on vitamin B12 supplementation as an outpatient  Hypomagnesemia Resolved with repletion.  Hypokalemia Replete.  Mild aortic valve stenosis On Transthoracic Echocardiogram. -Outpatient cardiology follow-up   DVT prophylaxis: Lovenox Code Status:   Code Status: Full Code Family Communication: Brother on telephone Disposition Plan: Discharge pending continued IV fluids for another 24 hours secondary to AKI and poor oral intake   Consultants:   Psychiatry  Procedures:   TRANSTHORACIC ECHOCARDIOGRAM (08/07/2020) IMPRESSIONS    1. Left ventricular ejection fraction, by estimation, is 60 to 65%. The  left ventricle has normal function. The left ventricle has no regional  wall motion abnormalities. There is mild left ventricular hypertrophy.  Left ventricular diastolic parameters  are consistent with Grade I diastolic dysfunction (impaired relaxation).  Elevated left atrial pressure.  2. Right ventricular systolic function is normal. The right ventricular  size is normal. There is normal pulmonary artery systolic pressure. The  estimated right ventricular systolic pressure is 78.6 mmHg.  3. Left atrial size was mildly dilated.  4. The mitral valve is normal in structure. No evidence of mitral valve  regurgitation. No evidence of mitral stenosis.  5. The aortic valve is calcified. There is mild calcification of the  aortic valve. There is mild thickening of the aortic valve. Aortic valve  regurgitation is mild. Mild aortic valve stenosis. Aortic valve area, by  VTI measures 1.64 cm. Aortic valve  mean gradient measures 9.0 mmHg. Aortic valve Vmax measures 2.37 m/s.  6. The inferior vena cava is normal in size with greater than 50%  respiratory  variability, suggesting right atrial pressure  of 3 mmHg.  Antimicrobials:  None    Subjective: No concerns. No hallucinations.  Objective: Vitals:   08/08/20 0952 08/08/20 1127 08/08/20 1700 08/09/20 0609  BP: (!) 203/97 (!) 165/73 (!) 155/78 (!) 152/77  Pulse: 72 75 69 70  Resp: 16 16 18 16   Temp: 97.9 F (36.6 C)  (!) 97 F (36.1 C) 98.3 F (36.8 C)  TempSrc: Oral  Oral Oral  SpO2: 99%  98% 97%  Weight:    70.1 kg  Height:        Intake/Output Summary (Last 24 hours) at 08/09/2020 1210 Last data filed at 08/08/2020 1835 Gross per 24 hour  Intake 400 ml  Output 1 ml  Net 399 ml   Filed Weights   08/06/20 2238 08/07/20 0520 08/09/20 0609  Weight: 69.4 kg 69.7 kg 70.1 kg    Examination:  General exam: Appears calm and comfortable Respiratory system: Clear to auscultation. Respiratory effort normal. Cardiovascular system: S1 & S2 heard, RRR. No murmurs, rubs, gallops or clicks. Gastrointestinal system: Abdomen is nondistended, soft and nontender. No organomegaly or masses felt. Normal bowel sounds heard. Central nervous system: Alert and oriented to person and place. Not oriented to time or situation. No focal neurological deficits. Musculoskeletal: No edema. No calf tenderness Skin: No cyanosis. No rashes Psychiatry: Judgement and insight appear normal. Mood & affect appropriate.     Data Reviewed: I have personally reviewed following labs and imaging studies  CBC Lab Results  Component Value Date   WBC 6.9 08/07/2020   RBC 4.05 08/07/2020   HGB 11.2 (L) 08/07/2020   HCT 35.7 (L) 08/07/2020   MCV 88.1 08/07/2020   MCH 27.7 08/07/2020   PLT 145 (L) 08/07/2020   MCHC 31.4 08/07/2020   RDW 14.1 08/07/2020   LYMPHSABS 2.1 08/06/2020   MONOABS 0.5 08/06/2020   EOSABS 0.1 08/06/2020   BASOSABS 0.0 70/35/0093     Last metabolic panel Lab Results  Component Value Date   NA 143 08/09/2020   K 3.0 (L) 08/09/2020   CL 110 08/09/2020   CO2 26  08/09/2020   BUN 11 08/09/2020   CREATININE 1.46 (H) 08/09/2020   GLUCOSE 106 (H) 08/09/2020   GFRNONAA 36 (L) 08/09/2020   GFRAA 52 (L) 06/15/2019   CALCIUM 8.4 (L) 08/09/2020   PHOS 3.4 06/07/2019   PROT 6.6 08/06/2020   ALBUMIN 3.5 08/06/2020   BILITOT 1.5 (H) 08/06/2020   ALKPHOS 71 08/06/2020   AST 26 08/06/2020   ALT 18 08/06/2020   ANIONGAP 7 08/09/2020    CBG (last 3)  Recent Labs    08/06/20 1225  GLUCAP 107*     GFR: Estimated Creatinine Clearance: 27.2 mL/min (A) (by C-G formula based on SCr of 1.46 mg/dL (H)).  Coagulation Profile: No results for input(s): INR, PROTIME in the last 168 hours.  Recent Results (from the past 240 hour(s))  Respiratory Panel by RT PCR (Flu A&B, Covid) - Nasopharyngeal Swab     Status: None   Collection Time: 08/06/20  2:05 PM   Specimen: Nasopharyngeal Swab  Result Value Ref Range Status   SARS Coronavirus 2 by RT PCR NEGATIVE NEGATIVE Final    Comment: (NOTE) SARS-CoV-2 target nucleic acids are NOT DETECTED.  The SARS-CoV-2 RNA is generally detectable in upper respiratoy specimens during the acute phase of infection. The lowest concentration of SARS-CoV-2 viral copies this assay can detect is 131 copies/mL. A negative result does not preclude SARS-Cov-2 infection and should not be used  as the sole basis for treatment or other patient management decisions. A negative result may occur with  improper specimen collection/handling, submission of specimen other than nasopharyngeal swab, presence of viral mutation(s) within the areas targeted by this assay, and inadequate number of viral copies (<131 copies/mL). A negative result must be combined with clinical observations, patient history, and epidemiological information. The expected result is Negative.  Fact Sheet for Patients:  PinkCheek.be  Fact Sheet for Healthcare Providers:  GravelBags.it  This test is no t yet  approved or cleared by the [place] FDA and  has been authorized for detection and/or diagnosis of SARS-CoV-2 by FDA under an Emergency Use Authorization (EUA). This EUA will remain  in effect (meaning this test can be used) for the duration of the COVID-19 declaration under Section 564(b)(1) of the Act, 21 U.S.C. section 360bbb-3(b)(1), unless the authorization is terminated or revoked sooner.     Influenza A by PCR NEGATIVE NEGATIVE Final   Influenza B by PCR NEGATIVE NEGATIVE Final    Comment: (NOTE) The Xpert Xpress SARS-CoV-2/FLU/RSV assay is intended as an aid in  the diagnosis of influenza from Nasopharyngeal swab specimens and  should not be used as a sole basis for treatment. Nasal washings and  aspirates are unacceptable for Xpert Xpress SARS-CoV-2/FLU/RSV  testing.  Fact Sheet for Patients: PinkCheek.be  Fact Sheet for Healthcare Providers: GravelBags.it  This test is not yet approved or cleared by the [place] FDA and  has been authorized for detection and/or diagnosis of SARS-CoV-2 by  FDA under an Emergency Use Authorization (EUA). This EUA will remain  in effect (meaning this test can be used) for the duration of the  Covid-19 declaration under Section 564(b)(1) of the Act, 21  U.S.C. section 360bbb-3(b)(1), unless the authorization is  terminated or revoked. Performed at [facility], [place] [address], [place], [place] [address]   Culture, Urine     Status: None   Collection Time: 08/08/20 10:07 AM   Specimen: Urine, Random  Result Value Ref Range Status   Specimen Description URINE, RANDOM  Final   Special Requests NONE  Final   Culture   Final    NO GROWTH Performed at [facility], [place] [address] [place], [place] [address]    Report Status 08/09/2020 FINAL  Final        Radiology Studies: No results found.      Scheduled Meds:  amLODipine  10 mg Oral Daily    enoxaparin (LOVENOX) injection  30 mg Subcutaneous Daily   influenza vaccine adjuvanted  0.5 mL Intramuscular Tomorrow-1000   sodium chloride flush  3 mL Intravenous Q12H   Continuous Infusions:  sodium chloride Stopped (08/08/20 0525)     LOS: 1 day     Cordelia Poche, MD [facility] 08/09/2020, 12:10 PM  If 7PM-7AM, please contact night-coverage [email or web address]

## 2020-08-09 NOTE — Consult Note (Signed)
Alicia Richardson is a 85 year old female with past medical history of renal mass status post right nephrectomy September 2020, B12 deficiency, hypertension, SVT, chronic kidney disease stage III who presented after a witnessed syncopal episode at home.  Psych consult was placed for probably underlying dementia here with behavioral issues, History of hallucinations with current symptoms, Family history Sister with hallucinations.  Patient was seen for psychiatric evaluation.  Patient was observed to be resting in bed.  She was alert and oriented, and noticed to be talking to her sitter who is at the bedside.  She appears to be fairly groomed, pleasant and engages well with Probation officer.  Patient is alert and oriented x4.  Patient appears to be improving from when she originally presented to the emergency room.  She is no longer observed to be restless, she is easily redirected, and can complete her ADLs independently. Patient denies any current confusion, hallucination, psychosis, and or delusional thinking.  She does not appear to be responding to internal stimuli.  She answers all questions appropriately.  She also denies any overt aggressive behaviors, agitation, and or behavioral disturbances as noted in the consult.  As per patient "I have been all my best behavior.  I will not hurt a flea.  I am going to continue to behave so that I go home tomorrow.  I do not need to be here they can use this bed for someone else.  "  She denies any suicidal thoughts, homicidal thoughts, and or self-harm behaviors.  Patient behaviors are consistent with the present with dementia and or other memory loss.  Patient is able to take care of self, however could benefit from increase in social interaction and person centered care, as dementia behaviors can worsen as the disease progresses.  Patient does admit to some decline in memory impairment however she feels as though she does not need to be in the hospital.  Patient denies any  previous psychiatric diagnosis, suicide attempts, and or inpatient admission.  On evaluation she continues to deny current suicidal ideation, suicidal thoughts and or suicidal gestures.   On evaluation patient is alert and oriented, calm and cooperative and actively participates in the assessment.  Patient does not show signs of memory impairment as evident by proper orientation, memory recall, and insight.  Discussed with patient current findings that are consistent with  dementia, and she is agreeable for follow-up outside of the hospital. Will psych clear at this time, it is encouraged that we continue with safety measures.

## 2020-08-09 NOTE — Progress Notes (Signed)
Physical Therapy Treatment Patient Details Name: Alicia Richardson MRN: [id number] DOB: 20-Jul-1935 Today's Date: 08/09/2020    History of Present Illness 85 yo female with onset of syncopal episode was admitted and nursing cleared orthostatics as source of issue.  Pt is in acute renal failure but no changes on telemetry.  AKI, aortic stenosis, and low Ma+.  PMHx:  HTN, B12 deficiency, nephrectomy, SVT with ablation, CKD3,     PT Comments    Pt was seen for coverage of longer walking distance as well as her need to try stairs.  Pt is min guard x minor issues with turning a RW on the hallway requiring min assist.  Follow up with her acutely for therapy needs that include endurance training and safety education, including sequencing to transfer and transition through stairs safely.  Pt is motivated but requires help to remember sequence and maintain correct hand placement throughout.  See for acute therapy goals.  Follow Up Recommendations  Home health PT;Supervision/Assistance - 24 hour     Equipment Recommendations  Rolling walker with 5" wheels    Recommendations for Other Services       Precautions / Restrictions Precautions Precautions: Fall Restrictions Weight Bearing Restrictions: No    Mobility  Bed Mobility Overal bed mobility: Needs Assistance Bed Mobility: Supine to Sit;Sit to Supine     Supine to sit: Min guard;Min assist Sit to supine: Min guard   General bed mobility comments: cued sequence  Transfers Overall transfer level: Needs assistance Equipment used: Rolling walker (2 wheeled);1 person hand held assist Transfers: Sit to/from [facility] Sit to Stand: Min guard Stand pivot transfers: Min assist       General transfer comment: tactile cues for sequencing and esp to get into and out of wheelchair  Ambulation/Gait Ambulation/Gait assistance: Min guard Gait Distance (Feet): 150 Feet Assistive device: Rolling walker (2 wheeled);1 person  hand held assist Gait Pattern/deviations: Step-through pattern;Decreased stride length;Narrow base of support;Trunk flexed Gait velocity: reduced   General Gait Details: help to turn walker as otherwise she will step out of it   Stairs Stairs: Yes Stairs assistance: Min guard Stair Management: Two rails;Step to pattern;Forwards Number of Stairs: 6 General stair comments: pt is slow and deliberate, alternates the dominant limb   Wheelchair Mobility    Modified Rankin (Stroke Patients Only)       Balance Overall balance assessment: Needs assistance Sitting-balance support: Feet supported Sitting balance-Leahy Scale: Good     Standing balance support: Bilateral upper extremity supported;During functional activity Standing balance-Leahy Scale: Fair Standing balance comment: poor dynamically                            Cognition Arousal/Alertness: Awake/alert Behavior During Therapy: WFL for tasks assessed/performed Overall Cognitive Status: Impaired/Different from baseline Area of Impairment: Problem solving;Awareness;Safety/judgement;Following commands;Memory;Attention                 Orientation Level: Situation Current Attention Level: Selective Memory: Decreased recall of precautions;Decreased short-term memory Following Commands: Follows one step commands inconsistently;Follows one step commands with increased time Safety/Judgement: Decreased awareness of safety;Decreased awareness of deficits Awareness: Intellectual Problem Solving: Slow processing;Requires verbal cues;Requires tactile cues General Comments: takes repetitive instructions for mobility      Exercises      General Comments General comments (skin integrity, edema, etc.): pt demosntrates walking ability on the hallway, on stairs and with cues for transfers.  Easily distracted  Pertinent Vitals/Pain Pain Assessment: No/denies pain    Home Living     Available Help at  Discharge: Family;Available PRN/intermittently Type of Home: House              Prior Function            PT Goals (current goals can now be found in the care plan section) Acute Rehab PT Goals Patient Stated Goal: to get home and follow up with strengthening Progress towards PT goals: Progressing toward goals    Frequency    Min 3X/week      PT Plan Current plan remains appropriate    Co-evaluation              AM-PAC PT "6 Clicks" Mobility   Outcome Measure  Help needed turning from your back to your side while in a flat bed without using bedrails?: None Help needed moving from lying on your back to sitting on the side of a flat bed without using bedrails?: A Little Help needed moving to and from a bed to a chair (including a wheelchair)?: A Little Help needed standing up from a chair using your arms (e.g., wheelchair or bedside chair)?: A Little Help needed to walk in hospital room?: A Little Help needed climbing 3-5 steps with a railing? : A Little 6 Click Score: 19    End of Session Equipment Utilized During Treatment: Gait belt Activity Tolerance: Patient tolerated treatment well;Patient limited by fatigue Patient left: in bed;with call bell/phone within reach;with bed alarm set;with nursing/sitter in room Nurse Communication: Mobility status PT Visit Diagnosis: Unsteadiness on feet (R26.81);Muscle weakness (generalized) (M62.81);Difficulty in walking, not elsewhere classified (R26.2)     Time: 0940-7680 PT Time Calculation (min) (ACUTE ONLY): 39 min  Charges:  $Gait Training: 8-22 mins $Therapeutic Activity: 23-37 mins                  Ramond Dial 08/09/2020, 4:59 PM  Mee Hives, PT MS Acute Rehab Dept. Number: [place] [facility] [place]

## 2020-08-09 NOTE — Plan of Care (Signed)
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
## 2020-08-10 LAB — BASIC METABOLIC PANEL
Anion gap: 6 (ref 5–15)
BUN: 21 mg/dL (ref 8–23)
CO2: 25 mmol/L (ref 22–32)
Calcium: 8.2 mg/dL — ABNORMAL LOW (ref 8.9–10.3)
Chloride: 111 mmol/L (ref 98–111)
Creatinine, Ser: 1.58 mg/dL — ABNORMAL HIGH (ref 0.44–1.00)
GFR, Estimated: 32 mL/min — ABNORMAL LOW (ref >60)
Glucose, Bld: 112 mg/dL — ABNORMAL HIGH (ref 70–99)
Potassium: 3.7 mmol/L (ref 3.5–5.1)
Sodium: 142 mmol/L (ref 135–145)
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
## 2020-08-10 NOTE — Progress Notes (Signed)
PROGRESS NOTE    CHERRISE OCCHIPINTI  FAO:[id number] DOB: [birth year] DOA: 08/06/2020 PCP: [name], MD   Brief Narrative: Alicia Richardson is a 86 y.o. female with a history of renal mass s/p nephrectomy, hypertension, vitamin B12 deficiency, SVT s/p ablation, CKD stage III. Patient presented secondary to syncope with concern for orthostatic hypotension.   Assessment & Plan:   Principal Problem:   Syncope Active Problems:   Essential hypertension   Acute renal failure superimposed on stage 3 chronic kidney disease (HCC)   B12 deficiency   Orthostatic hypotension   Syncope Appears to be secondary to orthostatic hypotension. Transthoracic Echocardiogram with mild AS and no other issue to explain syncope. Telemetry without any concerning arrhythmia. PT recommending HH with supervision (she, unfortunately lives alone). UDS negative.  Orthostatic hypotension Confirmed in ED. Presumably secondary to dehydration. Plan was to continue IV fluids but patient has removed multiple IVs.  Essential hypertension Patient is prescribed amlodipine and Coreg but does not take any antihypertensives. She also does not regularly follow-up with her primary care physician. Uncontrolled while inpatient. -Continue amlodipine 10 mg daily -Continue Hydralazine prn  Confusion/Delirium Cognitive impairment CT scan significant for cerebral atrophy. Patient with episodes of delirium in the hospital with hallucinations and what appear to be delusions. Patient symptoms improved and are likely mostly consistent with dementia with intermittent delirium. Patient would most likely benefit from ALF. SLP evaluated and patient scored 8/30 which, by my assessment, is consistent with dementia. Confusion today. -CSW recommendations for discharge planning  AKI on CKD stage IIIa Baseline creatinine of about 1-1.2. Creatinine of 1.89 on admission likely secondary to dehydration. Brother states she has poor oral intake. No  recurrent vomiting overnight and patient has pulled out IV lines. Creatinine has trended down from admission but stabilized and now slightly elevated. -Encourage good PO intake -Restart IV fluids -BMP in AM. If continues to be stable, this may be patient's baseline.  History of renal mass s/p right nephrectomy Noted.  B12 deficiency Patient is on vitamin B12 supplementation as an outpatient  Hypomagnesemia Resolved with repletion.  Hypokalemia Replete as needed  Mild aortic valve stenosis On Transthoracic Echocardiogram. -Outpatient cardiology follow-up   DVT prophylaxis: Lovenox Code Status:   Code Status: Full Code Family Communication: None at bedside Disposition Plan: Discharge pending continued IV fluids for another 24 hours (did not get ordered IV fluids yesterday) secondary to worsened AKI and poor oral intake. Not a safe discharge home at this time secondary to dementia.   Consultants:   Psychiatry  Procedures:   TRANSTHORACIC ECHOCARDIOGRAM (08/07/2020) IMPRESSIONS    1. Left ventricular ejection fraction, by estimation, is 60 to 65%. The  left ventricle has normal function. The left ventricle has no regional  wall motion abnormalities. There is mild left ventricular hypertrophy.  Left ventricular diastolic parameters  are consistent with Grade I diastolic dysfunction (impaired relaxation).  Elevated left atrial pressure.  2. Right ventricular systolic function is normal. The right ventricular  size is normal. There is normal pulmonary artery systolic pressure. The  estimated right ventricular systolic pressure is 69.6 mmHg.  3. Left atrial size was mildly dilated.  4. The mitral valve is normal in structure. No evidence of mitral valve  regurgitation. No evidence of mitral stenosis.  5. The aortic valve is calcified. There is mild calcification of the  aortic valve. There is mild thickening of the aortic valve. Aortic valve  regurgitation is mild.  Mild aortic valve stenosis. Aortic valve  area, by  VTI measures 1.64 cm. Aortic valve  mean gradient measures 9.0 mmHg. Aortic valve Vmax measures 2.37 m/s.  6. The inferior vena cava is normal in size with greater than 50%  respiratory variability, suggesting right atrial pressure of 3 mmHg.  Antimicrobials:  None    Subjective: No concerns today.  Objective: Vitals:   08/09/20 1416 08/09/20 2246 08/10/20 0335 08/10/20 0440  BP: (!) 145/74 (!) 180/90 (!) 154/71 (!) 166/84  Pulse: 84 76 66 74  Resp: 18 18 19 20   Temp: (!) 97.2 F (36.2 C) 98.3 F (36.8 C) 98.3 F (36.8 C) 98.3 F (36.8 C)  TempSrc: Oral Oral Oral Oral  SpO2: 98% 94% 93%   Weight:    70.2 kg  Height:        Intake/Output Summary (Last 24 hours) at 08/10/2020 1326 Last data filed at 08/10/2020 0650 Gross per 24 hour  Intake 120 ml  Output --  Net 120 ml   Filed Weights   08/07/20 0520 08/09/20 0609 08/10/20 0440  Weight: 69.7 kg 70.1 kg 70.2 kg    Examination:  General exam: Appears calm and comfortable Respiratory system: Clear to auscultation. Respiratory effort normal. Cardiovascular system: S1 & S2 heard, RRR. No murmurs, rubs, gallops or clicks. Gastrointestinal system: Abdomen is nondistended, soft and nontender. No organomegaly or masses felt. Normal bowel sounds heard. Central nervous system: Alert and oriented to person and place, although initially stated she was in a behavioral hospital. No focal neurological deficits. Musculoskeletal: No edema. No calf tenderness Skin: No cyanosis. No rashes Psychiatry: Judgement and insight appear impaired. Mood & affect appropriate.     Data Reviewed: I have personally reviewed following labs and imaging studies  CBC Lab Results  Component Value Date   WBC 6.9 08/07/2020   RBC 4.05 08/07/2020   HGB 11.2 (L) 08/07/2020   HCT 35.7 (L) 08/07/2020   MCV 88.1 08/07/2020   MCH 27.7 08/07/2020   PLT 145 (L) 08/07/2020   MCHC 31.4 08/07/2020    RDW 14.1 08/07/2020   LYMPHSABS 2.1 08/06/2020   MONOABS 0.5 08/06/2020   EOSABS 0.1 08/06/2020   BASOSABS 0.0 37/16/9678     Last metabolic panel Lab Results  Component Value Date   NA 142 08/10/2020   K 3.7 08/10/2020   CL 111 08/10/2020   CO2 25 08/10/2020   BUN 21 08/10/2020   CREATININE 1.58 (H) 08/10/2020   GLUCOSE 112 (H) 08/10/2020   GFRNONAA 32 (L) 08/10/2020   GFRAA 52 (L) 06/15/2019   CALCIUM 8.2 (L) 08/10/2020   PHOS 3.4 06/07/2019   PROT 6.6 08/06/2020   ALBUMIN 3.5 08/06/2020   BILITOT 1.5 (H) 08/06/2020   ALKPHOS 71 08/06/2020   AST 26 08/06/2020   ALT 18 08/06/2020   ANIONGAP 6 08/10/2020    CBG (last 3)  No results for input(s): GLUCAP in the last 72 hours.   GFR: Estimated Creatinine Clearance: 25.2 mL/min (A) (by C-G formula based on SCr of 1.58 mg/dL (H)).  Coagulation Profile: No results for input(s): INR, PROTIME in the last 168 hours.  Recent Results (from the past 240 hour(s))  Respiratory Panel by RT PCR (Flu A&B, Covid) - Nasopharyngeal Swab     Status: None   Collection Time: 08/06/20  2:05 PM   Specimen: Nasopharyngeal Swab  Result Value Ref Range Status   SARS Coronavirus 2 by RT PCR NEGATIVE NEGATIVE Final    Comment: (NOTE) SARS-CoV-2 target nucleic acids are NOT DETECTED.  The SARS-CoV-2 RNA is generally detectable in upper respiratoy specimens during the acute phase of infection. The lowest concentration of SARS-CoV-2 viral copies this assay can detect is 131 copies/mL. A negative result does not preclude SARS-Cov-2 infection and should not be used as the sole basis for treatment or other patient management decisions. A negative result may occur with  improper specimen collection/handling, submission of specimen other than nasopharyngeal swab, presence of viral mutation(s) within the areas targeted by this assay, and inadequate number of viral copies (<131 copies/mL). A negative result must be combined with  clinical observations, patient history, and epidemiological information. The expected result is Negative.  Fact Sheet for Patients:  PinkCheek.be  Fact Sheet for Healthcare Providers:  GravelBags.it  This test is no t yet approved or cleared by the [place] FDA and  has been authorized for detection and/or diagnosis of SARS-CoV-2 by FDA under an Emergency Use Authorization (EUA). This EUA will remain  in effect (meaning this test can be used) for the duration of the COVID-19 declaration under Section 564(b)(1) of the Act, 21 U.S.C. section 360bbb-3(b)(1), unless the authorization is terminated or revoked sooner.     Influenza A by PCR NEGATIVE NEGATIVE Final   Influenza B by PCR NEGATIVE NEGATIVE Final    Comment: (NOTE) The Xpert Xpress SARS-CoV-2/FLU/RSV assay is intended as an aid in  the diagnosis of influenza from Nasopharyngeal swab specimens and  should not be used as a sole basis for treatment. Nasal washings and  aspirates are unacceptable for Xpert Xpress SARS-CoV-2/FLU/RSV  testing.  Fact Sheet for Patients: PinkCheek.be  Fact Sheet for Healthcare Providers: GravelBags.it  This test is not yet approved or cleared by the [place] FDA and  has been authorized for detection and/or diagnosis of SARS-CoV-2 by  FDA under an Emergency Use Authorization (EUA). This EUA will remain  in effect (meaning this test can be used) for the duration of the  Covid-19 declaration under Section 564(b)(1) of the Act, 21  U.S.C. section 360bbb-3(b)(1), unless the authorization is  terminated or revoked. Performed at [facility], [place] [address], [place], [place] [address]   Culture, Urine     Status: None   Collection Time: 08/08/20 10:07 AM   Specimen: Urine, Random  Result Value Ref Range Status   Specimen Description URINE, RANDOM  Final   Special  Requests NONE  Final   Culture   Final    NO GROWTH Performed at [facility], [place] [address], [place],  [address]    Report Status 08/09/2020 FINAL  Final        Radiology Studies: No results found.      Scheduled Meds: . amLODipine  10 mg Oral Daily  . enoxaparin (LOVENOX) injection  30 mg Subcutaneous Daily  . influenza vaccine adjuvanted  0.5 mL Intramuscular Tomorrow-1000  . sodium chloride flush  3 mL Intravenous Q12H   Continuous Infusions: . sodium chloride Stopped (08/08/20 0525)     LOS: 2 days     Cordelia Poche, MD [facility] 08/10/2020, 1:26 PM  If 7PM-7AM, please contact night-coverage [email or web address]

## 2020-08-11 LAB — BASIC METABOLIC PANEL
Anion gap: 9 (ref 5–15)
BUN: 21 mg/dL (ref 8–23)
CO2: 22 mmol/L (ref 22–32)
Calcium: 8.5 mg/dL — ABNORMAL LOW (ref 8.9–10.3)
Chloride: 111 mmol/L (ref 98–111)
Creatinine, Ser: 1.52 mg/dL — ABNORMAL HIGH (ref 0.44–1.00)
GFR, Estimated: 34 mL/min — ABNORMAL LOW (ref >60)
Glucose, Bld: 99 mg/dL (ref 70–99)
Potassium: 3.5 mmol/L (ref 3.5–5.1)
Sodium: 142 mmol/L (ref 135–145)

## 2020-08-11 NOTE — Progress Notes (Signed)
PROGRESS NOTE    Alicia Richardson  KDX:[id number] DOB: April 29, 1935 DOA: 08/06/2020 PCP: [name], MD   Brief Narrative: Alicia Richardson is a 85 y.o. female with a history of renal mass s/p nephrectomy, hypertension, vitamin B12 deficiency, SVT s/p ablation, CKD stage III. Patient presented secondary to syncope with concern for orthostatic hypotension.   Assessment & Plan:   Principal Problem:   Syncope Active Problems:   Essential hypertension   Acute renal failure superimposed on stage 3 chronic kidney disease (HCC)   B12 deficiency   Orthostatic hypotension   Syncope Appears to be secondary to orthostatic hypotension. Transthoracic Echocardiogram with mild AS and no other issue to explain syncope. Telemetry without any concerning arrhythmia. PT recommending HH with supervision (she, unfortunately lives alone). UDS negative.  Orthostatic hypotension Confirmed in ED. Presumably secondary to dehydration. Plan was to continue IV fluids but patient has removed multiple IVs.  Essential hypertension Patient is prescribed amlodipine and Coreg but does not take any antihypertensives. She also does not regularly follow-up with her primary care physician. Uncontrolled while inpatient. -Continue amlodipine 10 mg daily -Continue Hydralazine prn  Confusion/Delirium Cognitive impairment CT scan significant for cerebral atrophy. Patient with episodes of delirium in the hospital with hallucinations and what appear to be delusions. Patient symptoms improved and are likely mostly consistent with dementia with intermittent delirium. Patient would most likely benefit from ALF. SLP evaluated and patient scored 8/30 which, by my assessment, is consistent with dementia. Confusion today. -CSW recommendations for discharge planning  AKI on CKD stage IIIa Baseline creatinine of about 1-1.2. Creatinine of 1.89 on admission likely secondary to dehydration. Brother states she has poor oral intake. No  recurrent vomiting overnight and patient has pulled out IV lines. Creatinine appears to be stable. -Encourage good PO intake  History of renal mass s/p right nephrectomy Noted.  B12 deficiency Patient is on vitamin B12 supplementation as an outpatient  Hypomagnesemia Resolved with repletion.  Hypokalemia Replete as needed  Mild aortic valve stenosis On Transthoracic Echocardiogram. -Outpatient cardiology follow-up   DVT prophylaxis: Lovenox Code Status:   Code Status: Full Code Family Communication: None at bedside Disposition Plan: Discharge pending safe discharge plan. Patient lives at home alone and has no family in the city. Patient has underlying dementia and requires 24 hour supervision, for which she does not have. Pending continued social work efforts for safe discharge plan   Consultants:   Psychiatry  Procedures:   TRANSTHORACIC ECHOCARDIOGRAM (08/07/2020) IMPRESSIONS    1. Left ventricular ejection fraction, by estimation, is 60 to 65%. The  left ventricle has normal function. The left ventricle has no regional  wall motion abnormalities. There is mild left ventricular hypertrophy.  Left ventricular diastolic parameters  are consistent with Grade I diastolic dysfunction (impaired relaxation).  Elevated left atrial pressure.  2. Right ventricular systolic function is normal. The right ventricular  size is normal. There is normal pulmonary artery systolic pressure. The  estimated right ventricular systolic pressure is 97.6 mmHg.  3. Left atrial size was mildly dilated.  4. The mitral valve is normal in structure. No evidence of mitral valve  regurgitation. No evidence of mitral stenosis.  5. The aortic valve is calcified. There is mild calcification of the  aortic valve. There is mild thickening of the aortic valve. Aortic valve  regurgitation is mild. Mild aortic valve stenosis. Aortic valve area, by  VTI measures 1.64 cm. Aortic valve  mean  gradient measures 9.0 mmHg. Aortic valve  Vmax measures 2.37 m/s.  6. The inferior vena cava is normal in size with greater than 50%  respiratory variability, suggesting right atrial pressure of 3 mmHg.  Antimicrobials:  None    Subjective: No issues  Objective: Vitals:   08/10/20 0335 08/10/20 0440 08/10/20 1938 08/11/20 0418  BP: (!) 154/71 (!) 166/84 (!) 183/92 (!) 183/83  Pulse: 66 74 90 72  Resp: 19 20 20 20   Temp: 98.3 F (36.8 C) 98.3 F (36.8 C) 98.4 F (36.9 C) 98.4 F (36.9 C)  TempSrc: Oral Oral Oral Oral  SpO2: 93%  96% 94%  Weight:  70.2 kg  74.4 kg  Height:        Intake/Output Summary (Last 24 hours) at 08/11/2020 1014 Last data filed at 08/11/2020 0643 Gross per 24 hour  Intake 120 ml  Output --  Net 120 ml   Filed Weights   08/09/20 0609 08/10/20 0440 08/11/20 0418  Weight: 70.1 kg 70.2 kg 74.4 kg    Examination:  General exam: Appears calm and comfortable Respiratory system: Clear to auscultation. Respiratory effort normal. Cardiovascular system: S1 & S2 heard, RRR. No murmurs, rubs, gallops or clicks. Gastrointestinal system: Abdomen is nondistended, soft and nontender. No organomegaly or masses felt. Normal bowel sounds heard. Central nervous system: Alert and oriented to person only. No focal neurological deficits. Musculoskeletal: No edema. No calf tenderness Skin: No cyanosis. No rashes Psychiatry: Judgement and insight appear impaired. Mood & affect appropriate.     Data Reviewed: I have personally reviewed following labs and imaging studies  CBC Lab Results  Component Value Date   WBC 6.9 08/07/2020   RBC 4.05 08/07/2020   HGB 11.2 (L) 08/07/2020   HCT 35.7 (L) 08/07/2020   MCV 88.1 08/07/2020   MCH 27.7 08/07/2020   PLT 145 (L) 08/07/2020   MCHC 31.4 08/07/2020   RDW 14.1 08/07/2020   LYMPHSABS 2.1 08/06/2020   MONOABS 0.5 08/06/2020   EOSABS 0.1 08/06/2020   BASOSABS 0.0 45/80/9983     Last metabolic panel Lab  Results  Component Value Date   NA 142 08/11/2020   K 3.5 08/11/2020   CL 111 08/11/2020   CO2 22 08/11/2020   BUN 21 08/11/2020   CREATININE 1.52 (H) 08/11/2020   GLUCOSE 99 08/11/2020   GFRNONAA 34 (L) 08/11/2020   GFRAA 52 (L) 06/15/2019   CALCIUM 8.5 (L) 08/11/2020   PHOS 3.4 06/07/2019   PROT 6.6 08/06/2020   ALBUMIN 3.5 08/06/2020   BILITOT 1.5 (H) 08/06/2020   ALKPHOS 71 08/06/2020   AST 26 08/06/2020   ALT 18 08/06/2020   ANIONGAP 9 08/11/2020    CBG (last 3)  No results for input(s): GLUCAP in the last 72 hours.   GFR: Estimated Creatinine Clearance: 26.9 mL/min (A) (by C-G formula based on SCr of 1.52 mg/dL (H)).  Coagulation Profile: No results for input(s): INR, PROTIME in the last 168 hours.  Recent Results (from the past 240 hour(s))  Respiratory Panel by RT PCR (Flu A&B, Covid) - Nasopharyngeal Swab     Status: None   Collection Time: 08/06/20  2:05 PM   Specimen: Nasopharyngeal Swab  Result Value Ref Range Status   SARS Coronavirus 2 by RT PCR NEGATIVE NEGATIVE Final    Comment: (NOTE) SARS-CoV-2 target nucleic acids are NOT DETECTED.  The SARS-CoV-2 RNA is generally detectable in upper respiratoy specimens during the acute phase of infection. The lowest concentration of SARS-CoV-2 viral copies this assay can detect is 131  copies/mL. A negative result does not preclude SARS-Cov-2 infection and should not be used as the sole basis for treatment or other patient management decisions. A negative result may occur with  improper specimen collection/handling, submission of specimen other than nasopharyngeal swab, presence of viral mutation(s) within the areas targeted by this assay, and inadequate number of viral copies (<131 copies/mL). A negative result must be combined with clinical observations, patient history, and epidemiological information. The expected result is Negative.  Fact Sheet for Patients:   PinkCheek.be  Fact Sheet for Healthcare Providers:  GravelBags.it  This test is no t yet approved or cleared by the [place] FDA and  has been authorized for detection and/or diagnosis of SARS-CoV-2 by FDA under an Emergency Use Authorization (EUA). This EUA will remain  in effect (meaning this test can be used) for the duration of the COVID-19 declaration under Section 564(b)(1) of the Act, 21 U.S.C. section 360bbb-3(b)(1), unless the authorization is terminated or revoked sooner.     Influenza A by PCR NEGATIVE NEGATIVE Final   Influenza B by PCR NEGATIVE NEGATIVE Final    Comment: (NOTE) The Xpert Xpress SARS-CoV-2/FLU/RSV assay is intended as an aid in  the diagnosis of influenza from Nasopharyngeal swab specimens and  should not be used as a sole basis for treatment. Nasal washings and  aspirates are unacceptable for Xpert Xpress SARS-CoV-2/FLU/RSV  testing.  Fact Sheet for Patients: PinkCheek.be  Fact Sheet for Healthcare Providers: GravelBags.it  This test is not yet approved or cleared by the [place] FDA and  has been authorized for detection and/or diagnosis of SARS-CoV-2 by  FDA under an Emergency Use Authorization (EUA). This EUA will remain  in effect (meaning this test can be used) for the duration of the  Covid-19 declaration under Section 564(b)(1) of the Act, 21  U.S.C. section 360bbb-3(b)(1), unless the authorization is  terminated or revoked. Performed at [facility], [place] [address] [place], [place] [address]   Culture, Urine     Status: None   Collection Time: 08/08/20 10:07 AM   Specimen: Urine, Random  Result Value Ref Range Status   Specimen Description URINE, RANDOM  Final   Special Requests NONE  Final   Culture   Final    NO GROWTH Performed at [facility], [place] [address], [place], [place] [address]     Report Status 08/09/2020 FINAL  Final        Radiology Studies: No results found.      Scheduled Meds: . amLODipine  10 mg Oral Daily  . enoxaparin (LOVENOX) injection  30 mg Subcutaneous Daily  . influenza vaccine adjuvanted  0.5 mL Intramuscular Tomorrow-1000  . sodium chloride flush  3 mL Intravenous Q12H   Continuous Infusions: . sodium chloride 100 mL/hr at 08/11/20 0552     LOS: 3 days     Cordelia Poche, MD [facility] 08/11/2020, 10:14 AM  If 7PM-7AM, please contact night-coverage [email or web address]

## 2020-08-12 LAB — SARS CORONAVIRUS 2 BY RT PCR (HOSPITAL ORDER, PERFORMED IN ~~LOC~~ HOSPITAL LAB): SARS Coronavirus 2: NEGATIVE

## 2020-08-12 NOTE — Progress Notes (Signed)
PROGRESS NOTE    Alicia Richardson  NTZ:[id number] DOB: 1935/01/02 DOA: 08/06/2020 PCP: [name], MD   Brief Narrative: Alicia Richardson is a 85 y.o. female with a history of renal mass s/p nephrectomy, hypertension, vitamin B12 deficiency, SVT s/p ablation, CKD stage III. Patient presented secondary to syncope with concern for orthostatic hypotension.   Assessment & Plan:   Principal Problem:   Syncope Active Problems:   Essential hypertension   Acute renal failure superimposed on stage 3 chronic kidney disease (HCC)   B12 deficiency   Orthostatic hypotension   Syncope Appears to be secondary to orthostatic hypotension. Transthoracic Echocardiogram with mild AS and no other issue to explain syncope. Telemetry without any concerning arrhythmia. PT recommending HH with supervision (she, unfortunately lives alone). UDS negative.  Orthostatic hypotension Confirmed in ED. Presumably secondary to dehydration. Plan was to continue IV fluids but patient has removed multiple IVs.  Essential hypertension Patient is prescribed amlodipine and Coreg but does not take any antihypertensives. She also does not regularly follow-up with her primary care physician. Uncontrolled while inpatient. -Continue amlodipine 10 mg daily -Continue Hydralazine prn  Confusion/Delirium Cognitive impairment CT scan significant for cerebral atrophy. Patient with episodes of delirium in the hospital with hallucinations and what appear to be delusions. Patient symptoms improved and are likely mostly consistent with dementia with intermittent delirium. Patient would most likely benefit from ALF. SLP evaluated and patient scored 8/30 which, by my assessment, is consistent with dementia. Confusion today. -CSW recommendations for discharge planning  AKI on CKD stage IIIa Baseline creatinine of about 1-1.2. Creatinine of 1.89 on admission likely secondary to dehydration. Brother states she has poor oral intake. No  recurrent vomiting overnight and patient has pulled out IV lines. Creatinine appears to be stable. -Encourage good PO intake  History of renal mass s/p right nephrectomy Noted.  B12 deficiency Patient is on vitamin B12 supplementation as an outpatient  Hypomagnesemia Resolved with repletion.  Hypokalemia Replete as needed  Mild aortic valve stenosis On Transthoracic Echocardiogram. -Outpatient cardiology follow-up   DVT prophylaxis: Lovenox Code Status:   Code Status: Full Code Family Communication: None at bedside Disposition Plan: Discharge pending safe discharge plan. Patient lives at home alone and has no family in the city. Patient has underlying dementia and requires 24 hour supervision, for which she does not have. Plan for discharge to SNF pending CSW   Consultants:   Psychiatry  Procedures:   TRANSTHORACIC ECHOCARDIOGRAM (08/07/2020) IMPRESSIONS    1. Left ventricular ejection fraction, by estimation, is 60 to 65%. The  left ventricle has normal function. The left ventricle has no regional  wall motion abnormalities. There is mild left ventricular hypertrophy.  Left ventricular diastolic parameters  are consistent with Grade I diastolic dysfunction (impaired relaxation).  Elevated left atrial pressure.  2. Right ventricular systolic function is normal. The right ventricular  size is normal. There is normal pulmonary artery systolic pressure. The  estimated right ventricular systolic pressure is 67.5 mmHg.  3. Left atrial size was mildly dilated.  4. The mitral valve is normal in structure. No evidence of mitral valve  regurgitation. No evidence of mitral stenosis.  5. The aortic valve is calcified. There is mild calcification of the  aortic valve. There is mild thickening of the aortic valve. Aortic valve  regurgitation is mild. Mild aortic valve stenosis. Aortic valve area, by  VTI measures 1.64 cm. Aortic valve  mean gradient measures 9.0 mmHg.  Aortic valve Vmax measures  2.37 m/s.  6. The inferior vena cava is normal in size with greater than 50%  respiratory variability, suggesting right atrial pressure of 3 mmHg.  Antimicrobials:  None    Subjective: No concerns today.  Objective: Vitals:   08/11/20 2106 08/11/20 2108 08/12/20 0415 08/12/20 0944  BP: (!) 188/76 (!) 188/76 (!) 181/84 (!) 157/82  Pulse: 79 79 74   Resp: 16 15 18    Temp: 98.6 F (37 C) 98.6 F (37 C) 99 F (37.2 C)   TempSrc:  Oral Oral   SpO2: 97% 97%    Weight:   70.1 kg   Height:        Intake/Output Summary (Last 24 hours) at 08/12/2020 1015 Last data filed at 08/12/2020 0653 Gross per 24 hour  Intake 120 ml  Output --  Net 120 ml   Filed Weights   08/10/20 0440 08/11/20 0418 08/12/20 0415  Weight: 70.2 kg 74.4 kg 70.1 kg    Examination:  General exam: Appears calm and comfortable Respiratory system: Clear to auscultation. Respiratory effort normal. Cardiovascular system: S1 & S2 heard, RRR. No murmurs, rubs, gallops or clicks. Gastrointestinal system: Abdomen is nondistended, soft and nontender. No organomegaly or masses felt. Normal bowel sounds heard. Central nervous system: Alert. No focal neurological deficits. Musculoskeletal: No edema. No calf tenderness Skin: No cyanosis. No rashes    Data Reviewed: I have personally reviewed following labs and imaging studies  CBC Lab Results  Component Value Date   WBC 6.9 08/07/2020   RBC 4.05 08/07/2020   HGB 11.2 (L) 08/07/2020   HCT 35.7 (L) 08/07/2020   MCV 88.1 08/07/2020   MCH 27.7 08/07/2020   PLT 145 (L) 08/07/2020   MCHC 31.4 08/07/2020   RDW 14.1 08/07/2020   LYMPHSABS 2.1 08/06/2020   MONOABS 0.5 08/06/2020   EOSABS 0.1 08/06/2020   BASOSABS 0.0 09/62/8366     Last metabolic panel Lab Results  Component Value Date   NA 142 08/11/2020   K 3.5 08/11/2020   CL 111 08/11/2020   CO2 22 08/11/2020   BUN 21 08/11/2020   CREATININE 1.52 (H) 08/11/2020    GLUCOSE 99 08/11/2020   GFRNONAA 34 (L) 08/11/2020   GFRAA 52 (L) 06/15/2019   CALCIUM 8.5 (L) 08/11/2020   PHOS 3.4 06/07/2019   PROT 6.6 08/06/2020   ALBUMIN 3.5 08/06/2020   BILITOT 1.5 (H) 08/06/2020   ALKPHOS 71 08/06/2020   AST 26 08/06/2020   ALT 18 08/06/2020   ANIONGAP 9 08/11/2020    CBG (last 3)  No results for input(s): GLUCAP in the last 72 hours.   GFR: Estimated Creatinine Clearance: 26.2 mL/min (A) (by C-G formula based on SCr of 1.52 mg/dL (H)).  Coagulation Profile: No results for input(s): INR, PROTIME in the last 168 hours.  Recent Results (from the past 240 hour(s))  Respiratory Panel by RT PCR (Flu A&B, Covid) - Nasopharyngeal Swab     Status: None   Collection Time: 08/06/20  2:05 PM   Specimen: Nasopharyngeal Swab  Result Value Ref Range Status   SARS Coronavirus 2 by RT PCR NEGATIVE NEGATIVE Final    Comment: (NOTE) SARS-CoV-2 target nucleic acids are NOT DETECTED.  The SARS-CoV-2 RNA is generally detectable in upper respiratoy specimens during the acute phase of infection. The lowest concentration of SARS-CoV-2 viral copies this assay can detect is 131 copies/mL. A negative result does not preclude SARS-Cov-2 infection and should not be used as the sole basis for treatment or  other patient management decisions. A negative result may occur with  improper specimen collection/handling, submission of specimen other than nasopharyngeal swab, presence of viral mutation(s) within the areas targeted by this assay, and inadequate number of viral copies (<131 copies/mL). A negative result must be combined with clinical observations, patient history, and epidemiological information. The expected result is Negative.  Fact Sheet for Patients:  PinkCheek.be  Fact Sheet for Healthcare Providers:  GravelBags.it  This test is no t yet approved or cleared by the [place] FDA and  has been  authorized for detection and/or diagnosis of SARS-CoV-2 by FDA under an Emergency Use Authorization (EUA). This EUA will remain  in effect (meaning this test can be used) for the duration of the COVID-19 declaration under Section 564(b)(1) of the Act, 21 U.S.C. section 360bbb-3(b)(1), unless the authorization is terminated or revoked sooner.     Influenza A by PCR NEGATIVE NEGATIVE Final   Influenza B by PCR NEGATIVE NEGATIVE Final    Comment: (NOTE) The Xpert Xpress SARS-CoV-2/FLU/RSV assay is intended as an aid in  the diagnosis of influenza from Nasopharyngeal swab specimens and  should not be used as a sole basis for treatment. Nasal washings and  aspirates are unacceptable for Xpert Xpress SARS-CoV-2/FLU/RSV  testing.  Fact Sheet for Patients: PinkCheek.be  Fact Sheet for Healthcare Providers: GravelBags.it  This test is not yet approved or cleared by the [place] FDA and  has been authorized for detection and/or diagnosis of SARS-CoV-2 by  FDA under an Emergency Use Authorization (EUA). This EUA will remain  in effect (meaning this test can be used) for the duration of the  Covid-19 declaration under Section 564(b)(1) of the Act, 21  U.S.C. section 360bbb-3(b)(1), unless the authorization is  terminated or revoked. Performed at [facility], [place] [address] [place], [place] [address]   Culture, Urine     Status: None   Collection Time: 08/08/20 10:07 AM   Specimen: Urine, Random  Result Value Ref Range Status   Specimen Description URINE, RANDOM  Final   Special Requests NONE  Final   Culture   Final    NO GROWTH Performed at [facility], [place] [address] [place], [place] [address]    Report Status 08/09/2020 FINAL  Final        Radiology Studies: No results found.      Scheduled Meds: . amLODipine  10 mg Oral Daily  . enoxaparin (LOVENOX) injection  30 mg Subcutaneous Daily  .  influenza vaccine adjuvanted  0.5 mL Intramuscular Tomorrow-1000  . sodium chloride flush  3 mL Intravenous Q12H   Continuous Infusions: . sodium chloride 100 mL/hr at 08/12/20 0044     LOS: 4 days     Cordelia Poche, MD [facility] 08/12/2020, 10:15 AM  If 7PM-7AM, please contact night-coverage [email or web address]

## 2020-08-12 NOTE — Progress Notes (Signed)
Physical Therapy Treatment Patient Details Name: Alicia Richardson MRN: [id number] DOB: [birth year] Today's Date: 08/12/2020    History of Present Illness 85 yo female with onset of syncopal episode was admitted and nursing cleared orthostatics as source of issue.  Pt is in acute renal failure but no changes on telemetry.  AKI, aortic stenosis, and low Ma+.  PMHx:  HTN, B12 deficiency, nephrectomy, SVT with ablation, CKD3, and dementia.    PT Comments    Pt supine in bed.  She is only oriented to her self.  Pt with poor safety awareness during session.  Pt limited due to fatigue.  Pt lacks 24 hr assistance at home so will update recommendations to SNF at this time.     Follow Up Recommendations  SNF;Supervision/Assistance - 24 hour     Equipment Recommendations  None recommended by PT (defer to next level of care.)    Recommendations for Other Services       Precautions / Restrictions Precautions Precautions: Fall Restrictions Other Position/Activity Restrictions: baseline dementia    Mobility  Bed Mobility Overal bed mobility: Needs Assistance Bed Mobility: Supine to Sit     Supine to sit: Supervision        Transfers Overall transfer level: Needs assistance Equipment used: None;4-wheeled walker Transfers: Sit to/from Stand Sit to Stand: Min guard         General transfer comment: Constant cues for locking and unlocking brakes on rollators.  Ambulation/Gait Ambulation/Gait assistance: Min guard Gait Distance (Feet): 150 Feet (x2) Assistive device: 4-wheeled walker;1 person hand held assist Gait Pattern/deviations: Step-through pattern;Decreased stride length;Narrow base of support;Trunk flexed Gait velocity: reduced   General Gait Details: Required seated rest period on rollator mid way.  Pt required assistance to improve posture and increased stride length.   Stairs             Wheelchair Mobility    Modified Rankin (Stroke Patients Only)        Balance Overall balance assessment: Needs assistance Sitting-balance support: Feet supported Sitting balance-Leahy Scale: Good       Standing balance-Leahy Scale: Fair Standing balance comment: poor dynamically                            Cognition Arousal/Alertness: Awake/alert Behavior During Therapy: WFL for tasks assessed/performed Overall Cognitive Status: Impaired/Different from baseline Area of Impairment: Following commands;Safety/judgement;Problem solving;Orientation                 Orientation Level: Disoriented to;Time;Situation;Place   Memory: Decreased recall of precautions;Decreased short-term memory Following Commands: Follows one step commands inconsistently;Follows one step commands with increased time Safety/Judgement: Decreased awareness of safety;Decreased awareness of deficits   Problem Solving: Slow processing;Requires verbal cues;Requires tactile cues General Comments: baseline dementia.      Exercises      General Comments        Pertinent Vitals/Pain Pain Assessment: No/denies pain    Home Living                      Prior Function            PT Goals (current goals can now be found in the care plan section) Acute Rehab PT Goals Patient Stated Goal: to get home and follow up with strengthening Potential to Achieve Goals: Good Progress towards PT goals: Progressing toward goals    Frequency    Min 3X/week      PT  Plan Current plan remains appropriate    Co-evaluation              AM-PAC PT "6 Clicks" Mobility   Outcome Measure  Help needed turning from your back to your side while in a flat bed without using bedrails?: None Help needed moving from lying on your back to sitting on the side of a flat bed without using bedrails?: A Little Help needed moving to and from a bed to a chair (including a wheelchair)?: A Little Help needed standing up from a chair using your arms (e.g., wheelchair or  bedside chair)?: A Little Help needed to walk in hospital room?: A Little Help needed climbing 3-5 steps with a railing? : A Little 6 Click Score: 19    End of Session Equipment Utilized During Treatment: Gait belt Activity Tolerance: Patient tolerated treatment well;Patient limited by fatigue Patient left: with call bell/phone within reach;with nursing/sitter in room;in chair;with chair alarm set Nurse Communication: Mobility status PT Visit Diagnosis: Unsteadiness on feet (R26.81);Muscle weakness (generalized) (M62.81);Difficulty in walking, not elsewhere classified (R26.2)     Time: 5747-3403 PT Time Calculation (min) (ACUTE ONLY): 27 min  Charges:  $Gait Training: 8-22 mins $Therapeutic Activity: 8-22 mins                     Erasmo Leventhal , PTA Acute Rehabilitation Services Pager [phone] Office [phone]     Teshaun Olarte Eli Hose 08/12/2020, 12:08 PM

## 2020-08-12 NOTE — Care Management Important Message (Signed)
Important Message  Patient Details  Name: Alicia Richardson MRN: [id number] Date of Birth: October 30, 1934   Medicare Important Message Given:  Yes     [name] 08/12/2020, 11:10 AM

## 2020-08-12 NOTE — Progress Notes (Addendum)
  Pt refusing to have her tele monitor device back on. She is very adamant to have it back, also  Mildly verbally combative and was pacing her room. Dr. Lonny Prude updated   With order to convince pt but it still refuses, to chart about her refusal.

## 2020-08-12 NOTE — TOC Progression Note (Signed)
Transition of Care [facility]) - Progression Note    Patient Details  Name: Alicia Richardson MRN: [id number] Date of Birth: Jun 22, 1935  Transition of Care [facility]) CM/SW [place], [place] Phone Number: 08/12/2020, 3:26 PM  Clinical Narrative:    Pt's family chose [facility] for SNF. Facility got Auth, Covid test ordered. Plan for DC in the morning. Family updated. SW will continue to follow for discharge.   Expected Discharge Plan: [place] Barriers to Discharge: Continued Medical Work up  Expected Discharge Plan and Services Expected Discharge Plan: [place] arrangements for the past 2 months: Single Family Home                                       Social Determinants of Health (SDOH) Interventions    Readmission Risk Interventions No flowsheet data found.

## 2020-08-13 DIAGNOSIS — N1832 Chronic kidney disease, stage 3b: Secondary | ICD-10-CM

## 2020-08-13 LAB — GLUCOSE, CAPILLARY: Glucose-Capillary: 98 mg/dL (ref 70–99)

## 2020-08-13 MED ORDER — ONDANSETRON HCL 4 MG/2ML IJ SOLN
4.0000 mg | Freq: Once | INTRAMUSCULAR | Status: AC
  Administered 2020-08-13: 4 mg via INTRAVENOUS
  Filled 2020-08-13: qty 2

## 2020-08-13 NOTE — Progress Notes (Signed)
Report called to Clinical biochemist at [facility].  Patient awaiting PTAR for transfer to [facility].

## 2020-08-13 NOTE — Discharge Summary (Signed)
Physician Discharge Summary  Alicia Richardson ZOX:[id number] DOB: 1935-03-21 DOA: 08/06/2020  PCP: [name], MD  Admit date: 08/06/2020 Discharge date: 08/13/2020  Admitted From: Home Disposition: SNF  Recommendations for Outpatient Follow-up:  1. Follow up with PCP in 1 week 2. Please obtain BMP/CBC in one week 3. Please follow up on the following pending results: None   Discharge Condition: Stable CODE STATUS: Full code Diet recommendation: Heart healthy if she will tolerate   Brief/Interim Summary:  Admission HPI written by Dwyane Dee, MD   Chief Complaint: passed out   HPI:  Alicia Richardson is an 85 yo female with PMH renal mass (s/p right nephrectomy 06/14/19), B12 deficiency, HTN, SVT (s/p ablation 2009) , CKD3 who presented to the ER after a witnessed syncopal episode at home.  Neighbors had noticed the patient walking to her mailbox leaning to the left; upon helping her sit down, she lost consciousness.  Upon EMS arrival she was hypotensive SBP 70, hypoxic (80% on room air), and unresponsive.  Just prior to EMS attempting to intubate patient, she became more responsive and blood pressure improved.  Since then, she has been slowly improving mentally since arrival to the ER as well as improvement in her blood pressure.  Vitals in the ER were relatively stable and unremarkable with no hypotension noted.  SBP ranged 150-160 and DBP 70s-80s.  CBC was normal Chemistries relatively unremarkable aside from mild increase in creatinine (1.89, baseline approximately 0.9 - 1) Trop negative x 2 BNP 38 Covid and flu negative Ammonia negative, lactic normal, TSH normal VBG 7.39/37  Orthostatic blood pressures were ordered while in the ER and found to be positive (151/141 >> 165/135 >> 167/87). HR 77>89>98  CXR was clear with no infiltrates, edema, effusions CT head showed chronic microvascular ischemic disease, remote lacunar infarcts in bilateral basal ganglia, and  ventricular dilation (similar on Sept 2020 [facility]).   She did endorse that she has felt dizzy over the past couple weeks.  She also noted that she has not been eating or drinking as much as usual however does state that she has been urinating her same amount with no difficulty.  She denies any nausea/vomiting.  She does endorse the occasional episode of diarrhea approximately once a week but nothing sustained or severe. She does have old antihypertensive medications listed on her med rec (currently in process) however states that the only medications she takes currently is a vitamin B12 and other vitamin supplements.  She does not take any prescription medications at this time she says.  She lives alone and takes care of herself.  She does not drive and her activity consists of using her cane to check the mail daily and getting around the house.  She is admitted for further syncope workup and echo.    Hospital course:  Syncope Appears to be secondary to orthostatic hypotension. Transthoracic Echocardiogram with mild AS and no other issue to explain syncope. Telemetry without any concerning arrhythmia. PT recommending HH with supervision (she, unfortunately lives alone). UDS negative.  Orthostatic hypotension Confirmed in ED. Presumably secondary to dehydration. Plan was to continue IV fluids but patient has removed multiple IVs.  Essential hypertension Patient is prescribed amlodipine and Coreg but does not take any antihypertensives. She also does not regularly follow-up with her primary care physician. Uncontrolled while inpatient. Discharge on amlodipine 10 mg daily. Coreg discontinued on discharge.  Confusion/Delirium Cognitive impairment CT scan significant for cerebral atrophy. Patient with episodes of delirium  in the hospital with hallucinations and what appear to be delusions. Patient symptoms improved and are likely mostly consistent with dementia with intermittent delirium.  Psychiatry consulted with no additional recommendations. Patient would most likely benefit from ALF. SLP evaluated and patient scored 8/30 on mini mental exam which, by my assessment, is consistent with dementia. Delirium resolved.  AKI on CKD stage IIIb Baseline creatinine of about 1-1.2 from one year prior. Creatinine of 1.89 on admission likely secondary to dehydration. Brother states she has poor oral intake. No recurrent vomiting overnight and patient has pulled out IV lines. Creatinine appears to be stable. Encourage good PO intake. Patient appears to have stabilized around 1.5 which may be new baseline.  History of renal mass s/p right nephrectomy Noted.  B12 deficiency Patient is on vitamin B12 supplementation as an outpatient  Hypomagnesemia Resolved with repletion.  Hypokalemia Replete as needed  Mild aortic valve stenosis On Transthoracic Echocardiogram. Outpatient cardiology follow-up  Discharge Diagnoses:  Principal Problem:   Syncope Active Problems:   Essential hypertension   Acute renal failure superimposed on stage 3 chronic kidney disease (HCC)   B12 deficiency   Orthostatic hypotension    Discharge Instructions  Discharge Instructions    Diet - low sodium heart healthy   Complete by: As directed    Increase activity slowly   Complete by: As directed      Allergies as of 08/13/2020      Reactions   Penicillins Other (See Comments)   Pt states she is not allergic to penicillin (??) Did it involve swelling of the face/tongue/throat, SOB, or low BP? Unk Did it involve sudden or severe rash/hives, skin peeling, or any reaction on the inside of your mouth or nose? Unk Did you need to seek medical attention at a hospital or doctor's office? Unk When did it last happen?Unk If all above answers are "NO", may proceed with cephalosporin use.      Medication List    STOP taking these medications   carvedilol 6.25 MG tablet Commonly known as:  COREG   docusate sodium 100 MG capsule Commonly known as: COLACE   HYDROcodone-acetaminophen 5-325 MG tablet Commonly known as: Norco   senna 8.6 MG Tabs tablet Commonly known as: SENOKOT     TAKE these medications   amLODipine 10 MG tablet Commonly known as: NORVASC Take 1 tablet (10 mg total) by mouth at bedtime.   ascorbic acid 500 MG tablet Commonly known as: VITAMIN C Take 500 mg by mouth daily.   cyanocobalamin 1000 MCG tablet Take 1 tablet (1,000 mcg total) by mouth daily.   VITAMIN E PO Take 1 capsule by mouth daily with breakfast.       Allergies  Allergen Reactions  . Penicillins Other (See Comments)    Pt states she is not allergic to penicillin (??) Did it involve swelling of the face/tongue/throat, SOB, or low BP? Unk Did it involve sudden or severe rash/hives, skin peeling, or any reaction on the inside of your mouth or nose? Unk Did you need to seek medical attention at a hospital or doctor's office? Unk When did it last happen?Unk If all above answers are "NO", may proceed with cephalosporin use.    Consultations:  Psychiatry   Procedures/Studies: DG Chest 2 View  Result Date: 08/06/2020 CLINICAL DATA:  Syncope for 1 day. EXAM: CHEST - 2 VIEW COMPARISON:  One-view chest x-ray 06/06/2019 FINDINGS: The heart is enlarged. No edema or effusion is present. No focal airspace disease  is evident. Degenerative changes in the thoracic spine are stable. IMPRESSION: 1. Stable cardiomegaly without failure. 2. No acute cardiopulmonary disease. Electronically Signed   By: San Morelle M.D.   On: 08/06/2020 13:24   CT HEAD WO CONTRAST  Result Date: 08/06/2020 CLINICAL DATA:  Altered mental status. EXAM: CT HEAD WITHOUT CONTRAST TECHNIQUE: Contiguous axial images were obtained from the base of the skull through the vertex without intravenous contrast. COMPARISON:  06/06/2019. FINDINGS: Brain: No evidence of acute large vascular territory infarction,  hemorrhage, hydrocephalus, extra-axial collection or mass lesion/mass effect. Similar patchy white matter hypoattenuation, compatible with chronic microvascular ischemic disease. Similar small remote lacunar infarcts in bilateral basal ganglia. Similar generalized cerebral volume loss with ex vacuo ventricular dilation. Vascular: Calcific atherosclerosis Skull: No acute fracture.  Hyperostosis frontalis. Sinuses/Orbits: No acute findings. Other: No mastoid effusions. IMPRESSION: 1. No evidence of acute intracranial abnormality. 2. Similar chronic microvascular ischemic disease, generalized atrophy and remote lacunar infarcts in bilateral basal ganglia. Electronically Signed   By: Margaretha Sheffield MD   On: 08/06/2020 13:49   ECHOCARDIOGRAM COMPLETE  Result Date: 08/07/2020    ECHOCARDIOGRAM REPORT   Patient Name:   GERTHA LICHTENBERG Date of Exam: 08/07/2020 Medical Rec #:  [id number]      Height:       62.0 in Accession #:    [id number]     Weight:       153.7 lb Date of Birth:  09/25/1935      BSA:          1.709 m Patient Age:    53 years       BP:           167/80 mmHg Patient Gender: F              HR:           63 bpm. Exam Location:  Inpatient Procedure: 2D Echo Indications:    Syncope R55  History:        Patient has no prior history of Echocardiogram examinations.                 Risk Factors:Hypertension.  Sonographer:    Mikki Santee RDCS (AE) Referring Phys: [place]  1. Left ventricular ejection fraction, by estimation, is 60 to 65%. The left ventricle has normal function. The left ventricle has no regional wall motion abnormalities. There is mild left ventricular hypertrophy. Left ventricular diastolic parameters are consistent with Grade I diastolic dysfunction (impaired relaxation). Elevated left atrial pressure.  2. Right ventricular systolic function is normal. The right ventricular size is normal. There is normal pulmonary artery systolic pressure. The estimated right  ventricular systolic pressure is 01.0 mmHg.  3. Left atrial size was mildly dilated.  4. The mitral valve is normal in structure. No evidence of mitral valve regurgitation. No evidence of mitral stenosis.  5. The aortic valve is calcified. There is mild calcification of the aortic valve. There is mild thickening of the aortic valve. Aortic valve regurgitation is mild. Mild aortic valve stenosis. Aortic valve area, by VTI measures 1.64 cm. Aortic valve mean gradient measures 9.0 mmHg. Aortic valve Vmax measures 2.37 m/s.  6. The inferior vena cava is normal in size with greater than 50% respiratory variability, suggesting right atrial pressure of 3 mmHg. FINDINGS  Left Ventricle: Left ventricular ejection fraction, by estimation, is 60 to 65%. The left ventricle has normal function. The left ventricle has no regional wall motion abnormalities.  The left ventricular internal cavity size was normal in size. There is  mild left ventricular hypertrophy. Left ventricular diastolic parameters are consistent with Grade I diastolic dysfunction (impaired relaxation). Elevated left atrial pressure. Right Ventricle: The right ventricular size is normal. No increase in right ventricular wall thickness. Right ventricular systolic function is normal. There is normal pulmonary artery systolic pressure. The tricuspid regurgitant velocity is 2.47 m/s, and  with an assumed right atrial pressure of 3 mmHg, the estimated right ventricular systolic pressure is 26.9 mmHg. Left Atrium: Left atrial size was mildly dilated. Right Atrium: Right atrial size was normal in size. Pericardium: There is no evidence of pericardial effusion. Mitral Valve: The mitral valve is normal in structure. No evidence of mitral valve regurgitation. No evidence of mitral valve stenosis. Tricuspid Valve: The tricuspid valve is normal in structure. Tricuspid valve regurgitation is mild . No evidence of tricuspid stenosis. Aortic Valve: The aortic valve is  calcified. There is mild calcification of the aortic valve. There is mild thickening of the aortic valve. Aortic valve regurgitation is mild. Aortic regurgitation PHT measures 476 msec. Mild aortic stenosis is present. Aortic valve mean gradient measures 9.0 mmHg. Aortic valve peak gradient measures 22.5 mmHg. Aortic valve area, by VTI measures 1.64 cm. Pulmonic Valve: The pulmonic valve was normal in structure. Pulmonic valve regurgitation is mild. No evidence of pulmonic stenosis. Aorta: The aortic root is normal in size and structure. Venous: The inferior vena cava is normal in size with greater than 50% respiratory variability, suggesting right atrial pressure of 3 mmHg. IAS/Shunts: No atrial level shunt detected by color flow Doppler.  LEFT VENTRICLE PLAX 2D LVIDd:         4.40 cm  Diastology LVIDs:         2.60 cm  LV e' medial:    3.07 cm/s LV PW:         1.20 cm  LV E/e' medial:  34.9 LV IVS:        1.30 cm  LV e' lateral:   4.64 cm/s LVOT diam:     2.20 cm  LV E/e' lateral: 23.1 LV SV:         87 LV SV Index:   51 LVOT Area:     3.80 cm  RIGHT VENTRICLE RV S prime:     12.10 cm/s TAPSE (M-mode): 1.9 cm LEFT ATRIUM             Index       RIGHT ATRIUM           Index LA diam:        3.00 cm 1.76 cm/m  RA Area:     15.20 cm LA Vol (A2C):   72.0 ml 42.12 ml/m RA Volume:   33.20 ml  19.42 ml/m LA Vol (A4C):   53.5 ml 31.30 ml/m LA Biplane Vol: 64.9 ml 37.97 ml/m  AORTIC VALVE AV Area (Vmax):    1.80 cm AV Area (Vmean):   2.09 cm AV Area (VTI):     1.64 cm AV Vmax:           237.00 cm/s AV Vmean:          137.000 cm/s AV VTI:            0.528 m AV Peak Grad:      22.5 mmHg AV Mean Grad:      9.0 mmHg LVOT Vmax:         112.00 cm/s LVOT Vmean:  75.500 cm/s LVOT VTI:          0.228 m LVOT/AV VTI ratio: 0.43 AI PHT:            476 msec  AORTA Ao Root diam: 3.10 cm MITRAL VALVE                TRICUSPID VALVE MV Area (PHT): 2.80 cm     TR Peak grad:   24.4 mmHg MV Decel Time: 271 msec     TR Vmax:         247.00 cm/s MV E velocity: 107.00 cm/s MV A velocity: 111.00 cm/s  SHUNTS MV E/A ratio:  0.96         Systemic VTI:  0.23 m                             Systemic Diam: 2.20 cm Candee Furbish MD Electronically signed by Candee Furbish MD Signature Date/Time: 08/07/2020/1:28:04 PM    Final       Subjective: No issues today.  Discharge Exam: Vitals:   08/13/20 0500 08/13/20 0913  BP: (!) 157/81 (!) 162/80  Pulse: 77 78  Resp: 18 20  Temp: 98.1 F (36.7 C) (!) 97.3 F (36.3 C)  SpO2: 96%    Vitals:   08/12/20 0944 08/12/20 2043 08/13/20 0500 08/13/20 0913  BP: (!) 157/82 138/78 (!) 157/81 (!) 162/80  Pulse:  75 77 78  Resp:  18 18 20   Temp:  97.6 F (36.4 C) 98.1 F (36.7 C) (!) 97.3 F (36.3 C)  TempSrc:  Oral Oral Oral  SpO2:  94% 96%   Weight:   69.4 kg   Height:        General: Pt is alert, awake, not in acute distress Cardiovascular: RRR, S1/S2 +, no rubs, no gallops Respiratory: CTA bilaterally, no wheezing, no rhonchi Abdominal: Soft, NT, ND, bowel sounds + Extremities: no edema, no cyanosis    The results of significant diagnostics from this hospitalization (including imaging, microbiology, ancillary and laboratory) are listed below for reference.     Microbiology: Recent Results (from the past 240 hour(s))  Respiratory Panel by RT PCR (Flu A&B, Covid) - Nasopharyngeal Swab     Status: None   Collection Time: 08/06/20  2:05 PM   Specimen: Nasopharyngeal Swab  Result Value Ref Range Status   SARS Coronavirus 2 by RT PCR NEGATIVE NEGATIVE Final    Comment: (NOTE) SARS-CoV-2 target nucleic acids are NOT DETECTED.  The SARS-CoV-2 RNA is generally detectable in upper respiratoy specimens during the acute phase of infection. The lowest concentration of SARS-CoV-2 viral copies this assay can detect is 131 copies/mL. A negative result does not preclude SARS-Cov-2 infection and should not be used as the sole basis for treatment or other patient management  decisions. A negative result may occur with  improper specimen collection/handling, submission of specimen other than nasopharyngeal swab, presence of viral mutation(s) within the areas targeted by this assay, and inadequate number of viral copies (<131 copies/mL). A negative result must be combined with clinical observations, patient history, and epidemiological information. The expected result is Negative.  Fact Sheet for Patients:  PinkCheek.be  Fact Sheet for Healthcare Providers:  GravelBags.it  This test is no t yet approved or cleared by the [place] FDA and  has been authorized for detection and/or diagnosis of SARS-CoV-2 by FDA under an Emergency Use Authorization (EUA). This EUA will remain  in effect (  meaning this test can be used) for the duration of the COVID-19 declaration under Section 564(b)(1) of the Act, 21 U.S.C. section 360bbb-3(b)(1), unless the authorization is terminated or revoked sooner.     Influenza A by PCR NEGATIVE NEGATIVE Final   Influenza B by PCR NEGATIVE NEGATIVE Final    Comment: (NOTE) The Xpert Xpress SARS-CoV-2/FLU/RSV assay is intended as an aid in  the diagnosis of influenza from Nasopharyngeal swab specimens and  should not be used as a sole basis for treatment. Nasal washings and  aspirates are unacceptable for Xpert Xpress SARS-CoV-2/FLU/RSV  testing.  Fact Sheet for Patients: PinkCheek.be  Fact Sheet for Healthcare Providers: GravelBags.it  This test is not yet approved or cleared by the [place] FDA and  has been authorized for detection and/or diagnosis of SARS-CoV-2 by  FDA under an Emergency Use Authorization (EUA). This EUA will remain  in effect (meaning this test can be used) for the duration of the  Covid-19 declaration under Section 564(b)(1) of the Act, 21  U.S.C. section 360bbb-3(b)(1), unless the  authorization is  terminated or revoked. Performed at [facility], [place] [address], [place], [place] [address]   Culture, Urine     Status: None   Collection Time: 08/08/20 10:07 AM   Specimen: Urine, Random  Result Value Ref Range Status   Specimen Description URINE, RANDOM  Final   Special Requests NONE  Final   Culture   Final    NO GROWTH Performed at [facility], [place] [address] [place], [place] [address]    Report Status 08/09/2020 FINAL  Final  SARS Coronavirus 2 by RT PCR (hospital order, performed in [facility] hospital lab) Nasopharyngeal Nasopharyngeal Swab     Status: None   Collection Time: 08/12/20  4:09 PM   Specimen: Nasopharyngeal Swab  Result Value Ref Range Status   SARS Coronavirus 2 NEGATIVE NEGATIVE Final    Comment: (NOTE) SARS-CoV-2 target nucleic acids are NOT DETECTED.  The SARS-CoV-2 RNA is generally detectable in upper and lower respiratory specimens during the acute phase of infection. The lowest concentration of SARS-CoV-2 viral copies this assay can detect is 250 copies / mL. A negative result does not preclude SARS-CoV-2 infection and should not be used as the sole basis for treatment or other patient management decisions.  A negative result may occur with improper specimen collection / handling, submission of specimen other than nasopharyngeal swab, presence of viral mutation(s) within the areas targeted by this assay, and inadequate number of viral copies (<250 copies / mL). A negative result must be combined with clinical observations, patient history, and epidemiological information.  Fact Sheet for Patients:   StrictlyIdeas.no  Fact Sheet for Healthcare Providers: BankingDealers.co.za  This test is not yet approved or  cleared by the [place] FDA and has been authorized for detection and/or diagnosis of SARS-CoV-2 by FDA under an Emergency Use Authorization (EUA).  This  EUA will remain in effect (meaning this test can be used) for the duration of the COVID-19 declaration under Section 564(b)(1) of the Act, 21 U.S.C. section 360bbb-3(b)(1), unless the authorization is terminated or revoked sooner.  Performed at [facility], [place] [address] [place], [place] [address]      Labs: BNP (last 3 results) Recent Labs    08/06/20 1241  BNP 37.8   Basic Metabolic Panel: Recent Labs  Lab 08/07/20 0229 08/08/20 0338 08/09/20 0218 08/10/20 0206 08/11/20 0244  NA 143 145 143 142  142  K 3.2* 3.1* 3.0* 3.7 3.5  CL 110 112* 110 111 111  CO2 25 25 26 25 22   GLUCOSE 116* 104* 106* 112* 99  BUN 13 7* 11 21 21   CREATININE 1.61* 1.40* 1.46* 1.58* 1.52*  CALCIUM 8.1* 9.0 8.4* 8.2* 8.5*  MG 1.6* 1.9  --   --   --    Liver Function Tests: Recent Labs  Lab 08/06/20 1240  AST 26  ALT 18  ALKPHOS 71  BILITOT 1.5*  PROT 6.6  ALBUMIN 3.5   No results for input(s): LIPASE, AMYLASE in the last 168 hours. Recent Labs  Lab 08/06/20 1415  AMMONIA 19   CBC: Recent Labs  Lab 08/06/20 1240 08/06/20 1422 08/07/20 0229  WBC 6.8  --  6.9  NEUTROABS 3.9  --   --   HGB 13.1 14.3 11.2*  HCT 43.5 42.0 35.7*  MCV 90.6  --  88.1  PLT 158  --  145*   Cardiac Enzymes: No results for input(s): CKTOTAL, CKMB, CKMBINDEX, TROPONINI in the last 168 hours. BNP: Invalid input(s): POCBNP CBG: Recent Labs  Lab 08/06/20 1225 08/13/20 0655  GLUCAP 107* 98   D-Dimer No results for input(s): DDIMER in the last 72 hours. Hgb A1c No results for input(s): HGBA1C in the last 72 hours. Lipid Profile No results for input(s): CHOL, HDL, LDLCALC, TRIG, CHOLHDL, LDLDIRECT in the last 72 hours. Thyroid function studies No results for input(s): TSH, T4TOTAL, T3FREE, THYROIDAB in the last 72 hours.  Invalid input(s): FREET3 Anemia work up No results for input(s): VITAMINB12, FOLATE, FERRITIN, TIBC, IRON, RETICCTPCT in the last 72 hours. Urinalysis     Component Value Date/Time   COLORURINE STRAW (A) 08/08/2020 1010   APPEARANCEUR CLEAR 08/08/2020 1010   LABSPEC 1.008 08/08/2020 1010   PHURINE 6.0 08/08/2020 1010   GLUCOSEU NEGATIVE 08/08/2020 1010   HGBUR SMALL (A) 08/08/2020 1010   BILIRUBINUR NEGATIVE 08/08/2020 1010   KETONESUR NEGATIVE 08/08/2020 1010   PROTEINUR NEGATIVE 08/08/2020 1010   UROBILINOGEN 0.2 08/15/2008 2143   NITRITE NEGATIVE 08/08/2020 1010   LEUKOCYTESUR TRACE (A) 08/08/2020 1010   Sepsis Labs Invalid input(s): PROCALCITONIN,  WBC,  LACTICIDVEN Microbiology Recent Results (from the past 240 hour(s))  Respiratory Panel by RT PCR (Flu A&B, Covid) - Nasopharyngeal Swab     Status: None   Collection Time: 08/06/20  2:05 PM   Specimen: Nasopharyngeal Swab  Result Value Ref Range Status   SARS Coronavirus 2 by RT PCR NEGATIVE NEGATIVE Final    Comment: (NOTE) SARS-CoV-2 target nucleic acids are NOT DETECTED.  The SARS-CoV-2 RNA is generally detectable in upper respiratoy specimens during the acute phase of infection. The lowest concentration of SARS-CoV-2 viral copies this assay can detect is 131 copies/mL. A negative result does not preclude SARS-Cov-2 infection and should not be used as the sole basis for treatment or other patient management decisions. A negative result may occur with  improper specimen collection/handling, submission of specimen other than nasopharyngeal swab, presence of viral mutation(s) within the areas targeted by this assay, and inadequate number of viral copies (<131 copies/mL). A negative result must be combined with clinical observations, patient history, and epidemiological information. The expected result is Negative.  Fact Sheet for Patients:  PinkCheek.be  Fact Sheet for Healthcare Providers:  GravelBags.it  This test is no t yet approved or cleared by the [place] FDA and  has been authorized for detection  and/or diagnosis of SARS-CoV-2 by FDA under an Emergency  Use Authorization (EUA). This EUA will remain  in effect (meaning this test can be used) for the duration of the COVID-19 declaration under Section 564(b)(1) of the Act, 21 U.S.C. section 360bbb-3(b)(1), unless the authorization is terminated or revoked sooner.     Influenza A by PCR NEGATIVE NEGATIVE Final   Influenza B by PCR NEGATIVE NEGATIVE Final    Comment: (NOTE) The Xpert Xpress SARS-CoV-2/FLU/RSV assay is intended as an aid in  the diagnosis of influenza from Nasopharyngeal swab specimens and  should not be used as a sole basis for treatment. Nasal washings and  aspirates are unacceptable for Xpert Xpress SARS-CoV-2/FLU/RSV  testing.  Fact Sheet for Patients: PinkCheek.be  Fact Sheet for Healthcare Providers: GravelBags.it  This test is not yet approved or cleared by the [place] FDA and  has been authorized for detection and/or diagnosis of SARS-CoV-2 by  FDA under an Emergency Use Authorization (EUA). This EUA will remain  in effect (meaning this test can be used) for the duration of the  Covid-19 declaration under Section 564(b)(1) of the Act, 21  U.S.C. section 360bbb-3(b)(1), unless the authorization is  terminated or revoked. Performed at [facility], [place] [address] [place], [place] [address]   Culture, Urine     Status: None   Collection Time: 08/08/20 10:07 AM   Specimen: Urine, Random  Result Value Ref Range Status   Specimen Description URINE, RANDOM  Final   Special Requests NONE  Final   Culture   Final    NO GROWTH Performed at [facility], [place] [address] [place], [place] [address]    Report Status 08/09/2020 FINAL  Final  SARS Coronavirus 2 by RT PCR (hospital order, performed in [facility] hospital lab) Nasopharyngeal Nasopharyngeal Swab     Status: None   Collection Time: 08/12/20  4:09 PM   Specimen:  Nasopharyngeal Swab  Result Value Ref Range Status   SARS Coronavirus 2 NEGATIVE NEGATIVE Final    Comment: (NOTE) SARS-CoV-2 target nucleic acids are NOT DETECTED.  The SARS-CoV-2 RNA is generally detectable in upper and lower respiratory specimens during the acute phase of infection. The lowest concentration of SARS-CoV-2 viral copies this assay can detect is 250 copies / mL. A negative result does not preclude SARS-CoV-2 infection and should not be used as the sole basis for treatment or other patient management decisions.  A negative result may occur with improper specimen collection / handling, submission of specimen other than nasopharyngeal swab, presence of viral mutation(s) within the areas targeted by this assay, and inadequate number of viral copies (<250 copies / mL). A negative result must be combined with clinical observations, patient history, and epidemiological information.  Fact Sheet for Patients:   StrictlyIdeas.no  Fact Sheet for Healthcare Providers: BankingDealers.co.za  This test is not yet approved or  cleared by the [place] FDA and has been authorized for detection and/or diagnosis of SARS-CoV-2 by FDA under an Emergency Use Authorization (EUA).  This EUA will remain in effect (meaning this test can be used) for the duration of the COVID-19 declaration under Section 564(b)(1) of the Act, 21 U.S.C. section 360bbb-3(b)(1), unless the authorization is terminated or revoked sooner.  Performed at [facility], [place] [address], [place], [place] [address]      Time coordinating discharge: 35 minutes  SIGNED:   Cordelia Poche, MD [facility] 08/13/2020, 10:46 AM

## 2020-08-13 NOTE — TOC Transition Note (Signed)
Transition of Care [facility]) - CM/SW Discharge Note   Patient Details  Name: JAUNICE MIRZA MRN: [id number] Date of Birth: [birth year]  [facility] CM/SW Contact:  Trula Ore, [place] Phone Number: 08/13/2020, 11:50 AM   Clinical Narrative:     Patient will DC to: [facility]  Anticipated DC date: 08/13/2020  Family notified: [name]  Transport by: Corey Harold  ?  Per MD patient ready for DC to [facility] . RN, patient, patient's family, and facility notified of DC. Discharge Summary sent to facility. RN given number for report tele#[phone] RM#[phone]. DC packet on chart. Ambulance transport requested for patient.  CSW signing off.  Final next level of care: Skilled Nursing Facility Barriers to Discharge: No Barriers Identified   Patient Goals and CMS Choice Patient states their goals for this hospitalization and ongoing recovery are:: Pt was unable to verbalize goals CMS Medicare.gov Compare Post Acute Care list provided to:: Patient Represenative (must comment) (brother steve) Choice offered to / list presented to : Sibling (brother steve)  Discharge Placement              Patient chooses bed at: [facility] Patient to be transferred to facility by: [place] Name of family member notified: [name] Patient and family notified of of transfer: 08/13/20  Discharge Plan and Services                                     Social Determinants of Health (SDOH) Interventions     Readmission Risk Interventions No flowsheet data found.

## 2020-08-13 NOTE — Progress Notes (Signed)
Physical Therapy Treatment Patient Details Name: Alicia Richardson MRN: [id number] DOB: 02-Jun-1935 Today's Date: 08/13/2020    History of Present Illness 85 yo female with onset of syncopal episode was admitted and nursing cleared orthostatics as source of issue.  Pt is in acute renal failure but no changes on telemetry.  AKI, aortic stenosis, and low Ma+.  PMHx:  HTN, B12 deficiency, nephrectomy, SVT with ablation, CKD3, and dementia.    PT Comments    Pt supine in bed on arrival this session.  Plan for d/c to SNF today.  Pt remains limited in safety awareness due to baseline dementia.  She is however tolerating increased distances well.    Follow Up Recommendations  SNF;Supervision/Assistance - 24 hour     Equipment Recommendations  None recommended by PT (defer to next level of care.)    Recommendations for Other Services       Precautions / Restrictions Precautions Precautions: Fall Restrictions Weight Bearing Restrictions: No Other Position/Activity Restrictions: baseline dementia    Mobility  Bed Mobility Overal bed mobility: Needs Assistance Bed Mobility: Supine to Sit     Supine to sit: Supervision        Transfers Overall transfer level: Needs assistance Equipment used: 4-wheeled walker Transfers: Sit to/from Stand Sit to Stand: Supervision         General transfer comment: Cues for locking brakes and hand placement.  Ambulation/Gait Ambulation/Gait assistance: Min guard Gait Distance (Feet): 300 Feet Assistive device: 4-wheeled walker Gait Pattern/deviations: Step-through pattern;Decreased stride length;Narrow base of support;Trunk flexed Gait velocity: reduced   General Gait Details: No rest period this session.  Pt does report fatigue.  Cues for obstacle negotiation and RW safety.   Stairs             Wheelchair Mobility    Modified Rankin (Stroke Patients Only)       Balance Overall balance assessment: Needs  assistance Sitting-balance support: Feet supported Sitting balance-Leahy Scale: Good       Standing balance-Leahy Scale: Fair                              Cognition Arousal/Alertness: Awake/alert Behavior During Therapy: WFL for tasks assessed/performed Overall Cognitive Status: Impaired/Different from baseline                                 General Comments: baseline dementia,  Continues to require repeated cueing and increased time to follow commands. She was able to recall her birthday today.  Pt continues to only be oriented to herself.      Exercises      General Comments        Pertinent Vitals/Pain Pain Assessment: No/denies pain    Home Living                      Prior Function            PT Goals (current goals can now be found in the care plan section) Acute Rehab PT Goals Patient Stated Goal: none stated Potential to Achieve Goals: Good Progress towards PT goals: Progressing toward goals    Frequency    Min 3X/week      PT Plan Current plan remains appropriate    Co-evaluation              AM-PAC PT "6 Clicks" Mobility  Outcome Measure  Help needed turning from your back to your side while in a flat bed without using bedrails?: None Help needed moving from lying on your back to sitting on the side of a flat bed without using bedrails?: None Help needed moving to and from a bed to a chair (including a wheelchair)?: A Little Help needed standing up from a chair using your arms (e.g., wheelchair or bedside chair)?: A Little Help needed to walk in hospital room?: A Little Help needed climbing 3-5 steps with a railing? : A Little 6 Click Score: 20    End of Session Equipment Utilized During Treatment: Gait belt Activity Tolerance: Patient tolerated treatment well;Patient limited by fatigue Patient left: with call bell/phone within reach;with nursing/sitter in room;in chair;with chair alarm set Nurse  Communication: Mobility status PT Visit Diagnosis: Unsteadiness on feet (R26.81);Muscle weakness (generalized) (M62.81);Difficulty in walking, not elsewhere classified (R26.2)     Time: 3646-8032 PT Time Calculation (min) (ACUTE ONLY): 16 min  Charges:  $Gait Training: 8-22 mins                     Erasmo Leventhal , PTA Acute Rehabilitation Services Pager [phone] Office [phone]     Alicia Richardson 08/13/2020, 12:07 PM

## 2020-08-13 NOTE — Discharge Instructions (Signed)
Alicia Richardson,  You were in the hospital because of passing out. This seems like it was related to blood pressure issues possibly secondary to dehydration. You are much better but appear to have cognitive impairment. You will need rehab and long term care going forward for safety reasons.

## 2020-10-03 DIAGNOSIS — I499 Cardiac arrhythmia, unspecified: Secondary | ICD-10-CM | POA: Diagnosis not present

## 2020-10-03 DIAGNOSIS — I10 Essential (primary) hypertension: Secondary | ICD-10-CM | POA: Diagnosis not present

## 2020-10-03 DIAGNOSIS — G301 Alzheimer's disease with late onset: Secondary | ICD-10-CM | POA: Diagnosis not present

## 2020-10-04 DIAGNOSIS — E559 Vitamin D deficiency, unspecified: Secondary | ICD-10-CM | POA: Diagnosis not present

## 2020-10-04 DIAGNOSIS — Z79899 Other long term (current) drug therapy: Secondary | ICD-10-CM | POA: Diagnosis not present

## 2020-10-04 DIAGNOSIS — E119 Type 2 diabetes mellitus without complications: Secondary | ICD-10-CM | POA: Diagnosis not present

## 2020-10-04 DIAGNOSIS — E038 Other specified hypothyroidism: Secondary | ICD-10-CM | POA: Diagnosis not present

## 2020-10-04 DIAGNOSIS — D518 Other vitamin B12 deficiency anemias: Secondary | ICD-10-CM | POA: Diagnosis not present

## 2020-10-04 DIAGNOSIS — E7849 Other hyperlipidemia: Secondary | ICD-10-CM | POA: Diagnosis not present

## 2020-10-08 DIAGNOSIS — U071 COVID-19: Secondary | ICD-10-CM | POA: Diagnosis not present

## 2020-10-10 DIAGNOSIS — R0602 Shortness of breath: Secondary | ICD-10-CM | POA: Diagnosis not present

## 2020-10-10 DIAGNOSIS — R6889 Other general symptoms and signs: Secondary | ICD-10-CM | POA: Diagnosis not present

## 2020-10-10 DIAGNOSIS — Z743 Need for continuous supervision: Secondary | ICD-10-CM | POA: Diagnosis not present

## 2020-10-10 DIAGNOSIS — I517 Cardiomegaly: Secondary | ICD-10-CM | POA: Diagnosis not present

## 2020-10-10 DIAGNOSIS — R059 Cough, unspecified: Secondary | ICD-10-CM | POA: Diagnosis not present

## 2020-10-10 DIAGNOSIS — R079 Chest pain, unspecified: Secondary | ICD-10-CM | POA: Diagnosis not present

## 2020-10-10 DIAGNOSIS — R0600 Dyspnea, unspecified: Secondary | ICD-10-CM | POA: Diagnosis not present

## 2020-10-10 DIAGNOSIS — R5383 Other fatigue: Secondary | ICD-10-CM | POA: Diagnosis not present

## 2020-10-10 DIAGNOSIS — U071 COVID-19: Secondary | ICD-10-CM | POA: Diagnosis not present

## 2020-10-10 DIAGNOSIS — R0789 Other chest pain: Secondary | ICD-10-CM | POA: Diagnosis not present

## 2020-10-10 DIAGNOSIS — R531 Weakness: Secondary | ICD-10-CM | POA: Diagnosis not present

## 2020-10-11 DIAGNOSIS — R531 Weakness: Secondary | ICD-10-CM | POA: Diagnosis not present

## 2020-10-11 DIAGNOSIS — R059 Cough, unspecified: Secondary | ICD-10-CM | POA: Diagnosis not present

## 2020-10-11 DIAGNOSIS — U071 COVID-19: Secondary | ICD-10-CM | POA: Diagnosis not present

## 2020-10-11 DIAGNOSIS — R279 Unspecified lack of coordination: Secondary | ICD-10-CM | POA: Diagnosis not present

## 2020-10-11 DIAGNOSIS — Z743 Need for continuous supervision: Secondary | ICD-10-CM | POA: Diagnosis not present

## 2020-10-17 DIAGNOSIS — G301 Alzheimer's disease with late onset: Secondary | ICD-10-CM | POA: Diagnosis not present

## 2020-10-17 DIAGNOSIS — U071 COVID-19: Secondary | ICD-10-CM | POA: Diagnosis not present

## 2020-10-17 DIAGNOSIS — I499 Cardiac arrhythmia, unspecified: Secondary | ICD-10-CM | POA: Diagnosis not present

## 2020-10-17 DIAGNOSIS — N189 Chronic kidney disease, unspecified: Secondary | ICD-10-CM | POA: Diagnosis not present

## 2020-10-17 DIAGNOSIS — I10 Essential (primary) hypertension: Secondary | ICD-10-CM | POA: Diagnosis not present

## 2020-10-21 DIAGNOSIS — U071 COVID-19: Secondary | ICD-10-CM | POA: Diagnosis not present

## 2020-11-14 DIAGNOSIS — N189 Chronic kidney disease, unspecified: Secondary | ICD-10-CM | POA: Diagnosis not present

## 2020-11-14 DIAGNOSIS — G301 Alzheimer's disease with late onset: Secondary | ICD-10-CM | POA: Diagnosis not present

## 2020-11-14 DIAGNOSIS — I499 Cardiac arrhythmia, unspecified: Secondary | ICD-10-CM | POA: Diagnosis not present

## 2020-11-14 DIAGNOSIS — I10 Essential (primary) hypertension: Secondary | ICD-10-CM | POA: Diagnosis not present

## 2020-12-12 DIAGNOSIS — I10 Essential (primary) hypertension: Secondary | ICD-10-CM | POA: Diagnosis not present

## 2020-12-12 DIAGNOSIS — I499 Cardiac arrhythmia, unspecified: Secondary | ICD-10-CM | POA: Diagnosis not present

## 2020-12-12 DIAGNOSIS — N189 Chronic kidney disease, unspecified: Secondary | ICD-10-CM | POA: Diagnosis not present

## 2020-12-12 DIAGNOSIS — E119 Type 2 diabetes mellitus without complications: Secondary | ICD-10-CM | POA: Diagnosis not present

## 2020-12-12 DIAGNOSIS — G301 Alzheimer's disease with late onset: Secondary | ICD-10-CM | POA: Diagnosis not present

## 2020-12-20 DIAGNOSIS — G301 Alzheimer's disease with late onset: Secondary | ICD-10-CM | POA: Diagnosis not present

## 2020-12-20 DIAGNOSIS — I10 Essential (primary) hypertension: Secondary | ICD-10-CM | POA: Diagnosis not present

## 2020-12-20 DIAGNOSIS — E559 Vitamin D deficiency, unspecified: Secondary | ICD-10-CM | POA: Diagnosis not present

## 2020-12-20 DIAGNOSIS — D518 Other vitamin B12 deficiency anemias: Secondary | ICD-10-CM | POA: Diagnosis not present

## 2020-12-20 DIAGNOSIS — E119 Type 2 diabetes mellitus without complications: Secondary | ICD-10-CM | POA: Diagnosis not present

## 2020-12-20 DIAGNOSIS — E038 Other specified hypothyroidism: Secondary | ICD-10-CM | POA: Diagnosis not present

## 2020-12-20 DIAGNOSIS — N189 Chronic kidney disease, unspecified: Secondary | ICD-10-CM | POA: Diagnosis not present

## 2021-01-09 DIAGNOSIS — E119 Type 2 diabetes mellitus without complications: Secondary | ICD-10-CM | POA: Diagnosis not present

## 2021-01-09 DIAGNOSIS — N189 Chronic kidney disease, unspecified: Secondary | ICD-10-CM | POA: Diagnosis not present

## 2021-01-09 DIAGNOSIS — G301 Alzheimer's disease with late onset: Secondary | ICD-10-CM | POA: Diagnosis not present

## 2021-01-22 DIAGNOSIS — D518 Other vitamin B12 deficiency anemias: Secondary | ICD-10-CM | POA: Diagnosis not present

## 2021-01-22 DIAGNOSIS — I10 Essential (primary) hypertension: Secondary | ICD-10-CM | POA: Diagnosis not present

## 2021-01-22 DIAGNOSIS — N189 Chronic kidney disease, unspecified: Secondary | ICD-10-CM | POA: Diagnosis not present

## 2021-01-22 DIAGNOSIS — E038 Other specified hypothyroidism: Secondary | ICD-10-CM | POA: Diagnosis not present

## 2021-01-22 DIAGNOSIS — E559 Vitamin D deficiency, unspecified: Secondary | ICD-10-CM | POA: Diagnosis not present

## 2021-01-22 DIAGNOSIS — G301 Alzheimer's disease with late onset: Secondary | ICD-10-CM | POA: Diagnosis not present

## 2021-01-22 DIAGNOSIS — E119 Type 2 diabetes mellitus without complications: Secondary | ICD-10-CM | POA: Diagnosis not present

## 2021-01-29 DIAGNOSIS — H25013 Cortical age-related cataract, bilateral: Secondary | ICD-10-CM | POA: Diagnosis not present

## 2021-01-29 DIAGNOSIS — H2513 Age-related nuclear cataract, bilateral: Secondary | ICD-10-CM | POA: Diagnosis not present

## 2021-02-05 DIAGNOSIS — N189 Chronic kidney disease, unspecified: Secondary | ICD-10-CM | POA: Diagnosis not present

## 2021-02-05 DIAGNOSIS — E119 Type 2 diabetes mellitus without complications: Secondary | ICD-10-CM | POA: Diagnosis not present

## 2021-02-05 DIAGNOSIS — E559 Vitamin D deficiency, unspecified: Secondary | ICD-10-CM | POA: Diagnosis not present

## 2021-02-05 DIAGNOSIS — D518 Other vitamin B12 deficiency anemias: Secondary | ICD-10-CM | POA: Diagnosis not present

## 2021-02-05 DIAGNOSIS — G301 Alzheimer's disease with late onset: Secondary | ICD-10-CM | POA: Diagnosis not present

## 2021-02-05 DIAGNOSIS — E038 Other specified hypothyroidism: Secondary | ICD-10-CM | POA: Diagnosis not present

## 2021-02-05 DIAGNOSIS — I10 Essential (primary) hypertension: Secondary | ICD-10-CM | POA: Diagnosis not present

## 2021-02-06 DIAGNOSIS — I10 Essential (primary) hypertension: Secondary | ICD-10-CM | POA: Diagnosis not present

## 2021-02-06 DIAGNOSIS — I499 Cardiac arrhythmia, unspecified: Secondary | ICD-10-CM | POA: Diagnosis not present

## 2021-02-06 DIAGNOSIS — E119 Type 2 diabetes mellitus without complications: Secondary | ICD-10-CM | POA: Diagnosis not present

## 2021-02-06 DIAGNOSIS — G301 Alzheimer's disease with late onset: Secondary | ICD-10-CM | POA: Diagnosis not present

## 2021-02-25 DIAGNOSIS — E559 Vitamin D deficiency, unspecified: Secondary | ICD-10-CM | POA: Diagnosis not present

## 2021-02-25 DIAGNOSIS — G301 Alzheimer's disease with late onset: Secondary | ICD-10-CM | POA: Diagnosis not present

## 2021-02-25 DIAGNOSIS — N189 Chronic kidney disease, unspecified: Secondary | ICD-10-CM | POA: Diagnosis not present

## 2021-02-25 DIAGNOSIS — E119 Type 2 diabetes mellitus without complications: Secondary | ICD-10-CM | POA: Diagnosis not present

## 2021-02-25 DIAGNOSIS — I10 Essential (primary) hypertension: Secondary | ICD-10-CM | POA: Diagnosis not present

## 2021-02-25 DIAGNOSIS — I499 Cardiac arrhythmia, unspecified: Secondary | ICD-10-CM | POA: Diagnosis not present

## 2021-03-04 DIAGNOSIS — R011 Cardiac murmur, unspecified: Secondary | ICD-10-CM | POA: Diagnosis not present

## 2021-03-05 DIAGNOSIS — E7849 Other hyperlipidemia: Secondary | ICD-10-CM | POA: Diagnosis not present

## 2021-03-05 DIAGNOSIS — Z79899 Other long term (current) drug therapy: Secondary | ICD-10-CM | POA: Diagnosis not present

## 2021-03-05 DIAGNOSIS — E119 Type 2 diabetes mellitus without complications: Secondary | ICD-10-CM | POA: Diagnosis not present

## 2021-03-05 DIAGNOSIS — E038 Other specified hypothyroidism: Secondary | ICD-10-CM | POA: Diagnosis not present

## 2021-03-05 DIAGNOSIS — E559 Vitamin D deficiency, unspecified: Secondary | ICD-10-CM | POA: Diagnosis not present

## 2021-03-05 DIAGNOSIS — D518 Other vitamin B12 deficiency anemias: Secondary | ICD-10-CM | POA: Diagnosis not present

## 2021-03-06 DIAGNOSIS — E785 Hyperlipidemia, unspecified: Secondary | ICD-10-CM | POA: Diagnosis not present

## 2021-03-06 DIAGNOSIS — E119 Type 2 diabetes mellitus without complications: Secondary | ICD-10-CM | POA: Diagnosis not present

## 2021-03-06 DIAGNOSIS — E559 Vitamin D deficiency, unspecified: Secondary | ICD-10-CM | POA: Diagnosis not present

## 2021-03-06 DIAGNOSIS — I10 Essential (primary) hypertension: Secondary | ICD-10-CM | POA: Diagnosis not present

## 2021-03-06 DIAGNOSIS — I499 Cardiac arrhythmia, unspecified: Secondary | ICD-10-CM | POA: Diagnosis not present

## 2021-03-06 DIAGNOSIS — N189 Chronic kidney disease, unspecified: Secondary | ICD-10-CM | POA: Diagnosis not present

## 2021-03-06 DIAGNOSIS — G301 Alzheimer's disease with late onset: Secondary | ICD-10-CM | POA: Diagnosis not present

## 2021-03-18 DIAGNOSIS — G301 Alzheimer's disease with late onset: Secondary | ICD-10-CM | POA: Diagnosis not present

## 2021-03-18 DIAGNOSIS — I10 Essential (primary) hypertension: Secondary | ICD-10-CM | POA: Diagnosis not present

## 2021-03-18 DIAGNOSIS — E119 Type 2 diabetes mellitus without complications: Secondary | ICD-10-CM | POA: Diagnosis not present

## 2021-03-18 DIAGNOSIS — E038 Other specified hypothyroidism: Secondary | ICD-10-CM | POA: Diagnosis not present

## 2021-03-18 DIAGNOSIS — D518 Other vitamin B12 deficiency anemias: Secondary | ICD-10-CM | POA: Diagnosis not present

## 2021-03-18 DIAGNOSIS — E785 Hyperlipidemia, unspecified: Secondary | ICD-10-CM | POA: Diagnosis not present

## 2021-03-18 DIAGNOSIS — E559 Vitamin D deficiency, unspecified: Secondary | ICD-10-CM | POA: Diagnosis not present

## 2021-03-18 DIAGNOSIS — N189 Chronic kidney disease, unspecified: Secondary | ICD-10-CM | POA: Diagnosis not present

## 2021-03-29 DIAGNOSIS — I10 Essential (primary) hypertension: Secondary | ICD-10-CM | POA: Diagnosis not present

## 2021-04-03 DIAGNOSIS — E785 Hyperlipidemia, unspecified: Secondary | ICD-10-CM | POA: Diagnosis not present

## 2021-04-03 DIAGNOSIS — G301 Alzheimer's disease with late onset: Secondary | ICD-10-CM | POA: Diagnosis not present

## 2021-04-03 DIAGNOSIS — N189 Chronic kidney disease, unspecified: Secondary | ICD-10-CM | POA: Diagnosis not present

## 2021-04-03 DIAGNOSIS — I499 Cardiac arrhythmia, unspecified: Secondary | ICD-10-CM | POA: Diagnosis not present

## 2021-04-09 NOTE — Telephone Encounter (Signed)
Records shredded since patient did not call to schedule.

## 2021-04-14 DIAGNOSIS — G301 Alzheimer's disease with late onset: Secondary | ICD-10-CM | POA: Diagnosis not present

## 2021-04-14 DIAGNOSIS — E785 Hyperlipidemia, unspecified: Secondary | ICD-10-CM | POA: Diagnosis not present

## 2021-04-14 DIAGNOSIS — D518 Other vitamin B12 deficiency anemias: Secondary | ICD-10-CM | POA: Diagnosis not present

## 2021-04-14 DIAGNOSIS — N189 Chronic kidney disease, unspecified: Secondary | ICD-10-CM | POA: Diagnosis not present

## 2021-04-14 DIAGNOSIS — I10 Essential (primary) hypertension: Secondary | ICD-10-CM | POA: Diagnosis not present

## 2021-04-14 DIAGNOSIS — E119 Type 2 diabetes mellitus without complications: Secondary | ICD-10-CM | POA: Diagnosis not present

## 2021-04-14 DIAGNOSIS — E559 Vitamin D deficiency, unspecified: Secondary | ICD-10-CM | POA: Diagnosis not present

## 2021-04-14 DIAGNOSIS — E038 Other specified hypothyroidism: Secondary | ICD-10-CM | POA: Diagnosis not present

## 2021-05-06 DIAGNOSIS — Z87440 Personal history of urinary (tract) infections: Secondary | ICD-10-CM | POA: Diagnosis not present

## 2021-05-06 DIAGNOSIS — Z9181 History of falling: Secondary | ICD-10-CM | POA: Diagnosis not present

## 2021-05-06 DIAGNOSIS — M1990 Unspecified osteoarthritis, unspecified site: Secondary | ICD-10-CM | POA: Diagnosis not present

## 2021-05-06 DIAGNOSIS — Z905 Acquired absence of kidney: Secondary | ICD-10-CM | POA: Diagnosis not present

## 2021-05-06 DIAGNOSIS — R270 Ataxia, unspecified: Secondary | ICD-10-CM | POA: Diagnosis not present

## 2021-05-07 DIAGNOSIS — Z87440 Personal history of urinary (tract) infections: Secondary | ICD-10-CM | POA: Diagnosis not present

## 2021-05-07 DIAGNOSIS — R270 Ataxia, unspecified: Secondary | ICD-10-CM | POA: Diagnosis not present

## 2021-05-07 DIAGNOSIS — Z9181 History of falling: Secondary | ICD-10-CM | POA: Diagnosis not present

## 2021-05-07 DIAGNOSIS — Z905 Acquired absence of kidney: Secondary | ICD-10-CM | POA: Diagnosis not present

## 2021-05-07 DIAGNOSIS — M1990 Unspecified osteoarthritis, unspecified site: Secondary | ICD-10-CM | POA: Diagnosis not present

## 2021-05-09 DIAGNOSIS — Z87440 Personal history of urinary (tract) infections: Secondary | ICD-10-CM | POA: Diagnosis not present

## 2021-05-09 DIAGNOSIS — M1990 Unspecified osteoarthritis, unspecified site: Secondary | ICD-10-CM | POA: Diagnosis not present

## 2021-05-09 DIAGNOSIS — Z905 Acquired absence of kidney: Secondary | ICD-10-CM | POA: Diagnosis not present

## 2021-05-09 DIAGNOSIS — R270 Ataxia, unspecified: Secondary | ICD-10-CM | POA: Diagnosis not present

## 2021-05-09 DIAGNOSIS — Z9181 History of falling: Secondary | ICD-10-CM | POA: Diagnosis not present

## 2021-05-12 DIAGNOSIS — Z9181 History of falling: Secondary | ICD-10-CM | POA: Diagnosis not present

## 2021-05-12 DIAGNOSIS — R270 Ataxia, unspecified: Secondary | ICD-10-CM | POA: Diagnosis not present

## 2021-05-12 DIAGNOSIS — M1990 Unspecified osteoarthritis, unspecified site: Secondary | ICD-10-CM | POA: Diagnosis not present

## 2021-05-12 DIAGNOSIS — Z87440 Personal history of urinary (tract) infections: Secondary | ICD-10-CM | POA: Diagnosis not present

## 2021-05-12 DIAGNOSIS — Z905 Acquired absence of kidney: Secondary | ICD-10-CM | POA: Diagnosis not present

## 2021-05-16 DIAGNOSIS — Z905 Acquired absence of kidney: Secondary | ICD-10-CM | POA: Diagnosis not present

## 2021-05-16 DIAGNOSIS — R270 Ataxia, unspecified: Secondary | ICD-10-CM | POA: Diagnosis not present

## 2021-05-16 DIAGNOSIS — Z87440 Personal history of urinary (tract) infections: Secondary | ICD-10-CM | POA: Diagnosis not present

## 2021-05-16 DIAGNOSIS — Z9181 History of falling: Secondary | ICD-10-CM | POA: Diagnosis not present

## 2021-05-16 DIAGNOSIS — M1990 Unspecified osteoarthritis, unspecified site: Secondary | ICD-10-CM | POA: Diagnosis not present

## 2021-05-19 DIAGNOSIS — Z87440 Personal history of urinary (tract) infections: Secondary | ICD-10-CM | POA: Diagnosis not present

## 2021-05-19 DIAGNOSIS — M1990 Unspecified osteoarthritis, unspecified site: Secondary | ICD-10-CM | POA: Diagnosis not present

## 2021-05-19 DIAGNOSIS — Z905 Acquired absence of kidney: Secondary | ICD-10-CM | POA: Diagnosis not present

## 2021-05-19 DIAGNOSIS — Z9181 History of falling: Secondary | ICD-10-CM | POA: Diagnosis not present

## 2021-05-19 DIAGNOSIS — R270 Ataxia, unspecified: Secondary | ICD-10-CM | POA: Diagnosis not present

## 2021-05-23 DIAGNOSIS — M1990 Unspecified osteoarthritis, unspecified site: Secondary | ICD-10-CM | POA: Diagnosis not present

## 2021-05-23 DIAGNOSIS — Z905 Acquired absence of kidney: Secondary | ICD-10-CM | POA: Diagnosis not present

## 2021-05-23 DIAGNOSIS — R270 Ataxia, unspecified: Secondary | ICD-10-CM | POA: Diagnosis not present

## 2021-05-23 DIAGNOSIS — Z9181 History of falling: Secondary | ICD-10-CM | POA: Diagnosis not present

## 2021-05-23 DIAGNOSIS — Z87440 Personal history of urinary (tract) infections: Secondary | ICD-10-CM | POA: Diagnosis not present

## 2021-05-27 DIAGNOSIS — Z9181 History of falling: Secondary | ICD-10-CM | POA: Diagnosis not present

## 2021-05-27 DIAGNOSIS — Z905 Acquired absence of kidney: Secondary | ICD-10-CM | POA: Diagnosis not present

## 2021-05-27 DIAGNOSIS — R270 Ataxia, unspecified: Secondary | ICD-10-CM | POA: Diagnosis not present

## 2021-05-27 DIAGNOSIS — M1990 Unspecified osteoarthritis, unspecified site: Secondary | ICD-10-CM | POA: Diagnosis not present

## 2021-05-27 DIAGNOSIS — Z87440 Personal history of urinary (tract) infections: Secondary | ICD-10-CM | POA: Diagnosis not present
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
## 2021-07-12 IMAGING — US US RENAL
1 series · 14 of 25 positions shown · non-contrast
Comparison: Plain films lumbar spine 04/23/2019.

CLINICAL DATA: Right renal stone seen on plain films of the lumbar
spine.

EXAM:
RENAL / URINARY TRACT ULTRASOUND COMPLETE

[Series 1: us renal · 14 of 31 slices shown]
[im 1/31]
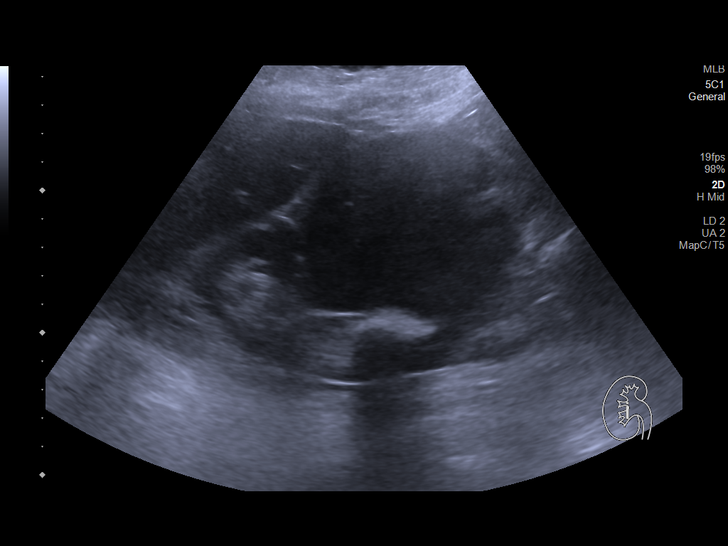
[im 3/31]
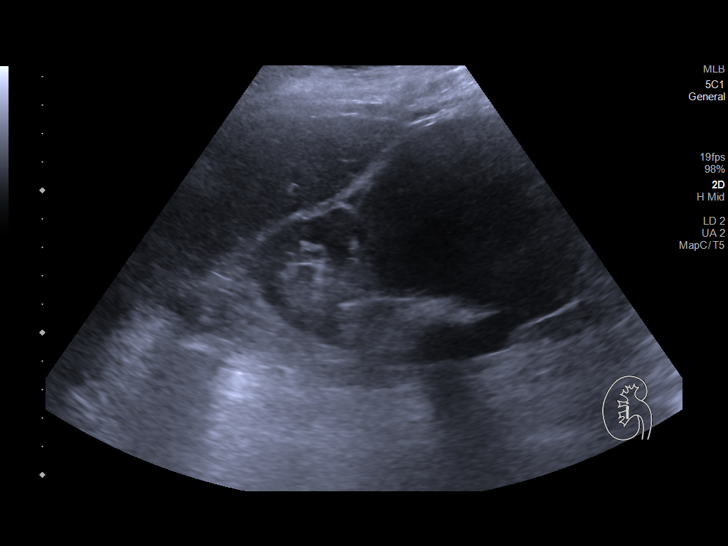
[im 6/31]
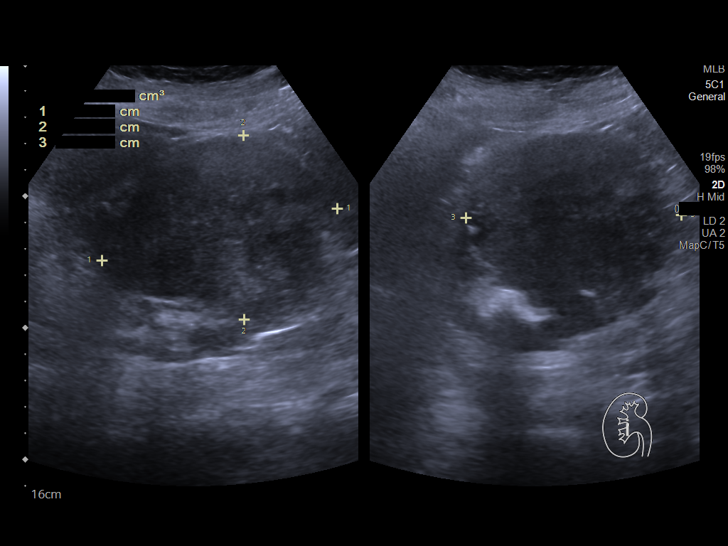
[im 8/31]
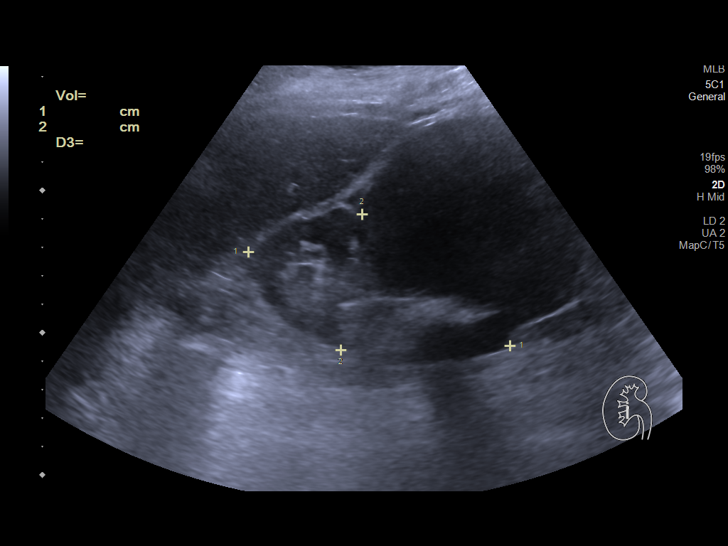
[im 11/31]
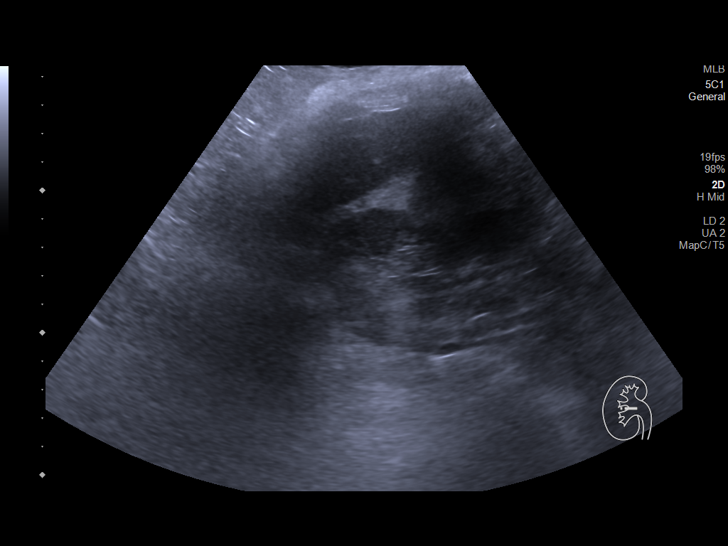
[im 12/31]
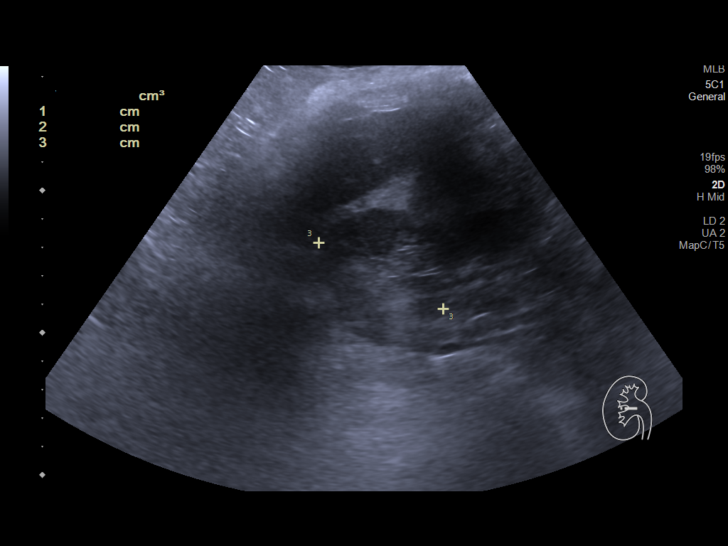
[im 14/31]
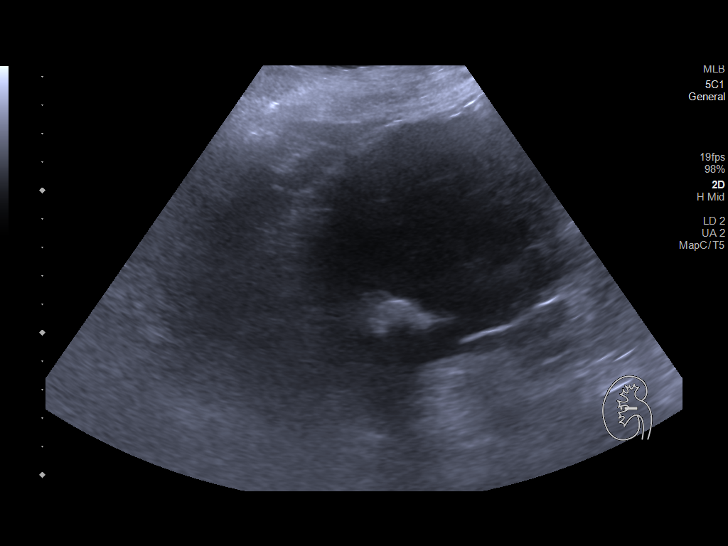
[im 17/31]
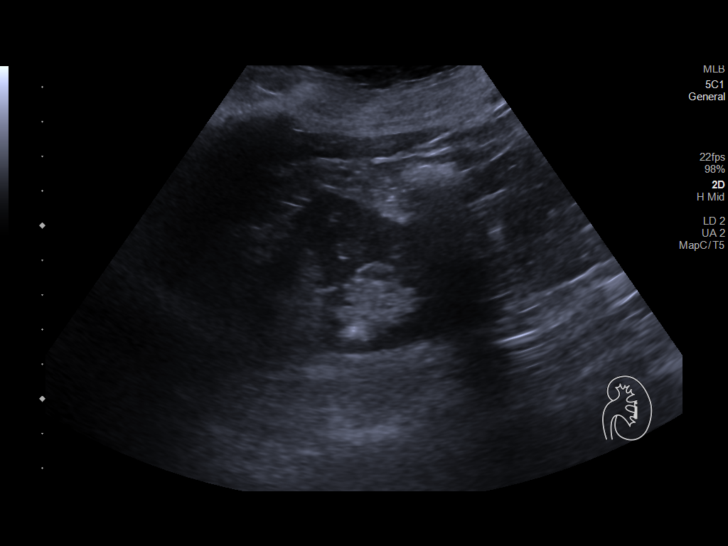
[im 19/31]
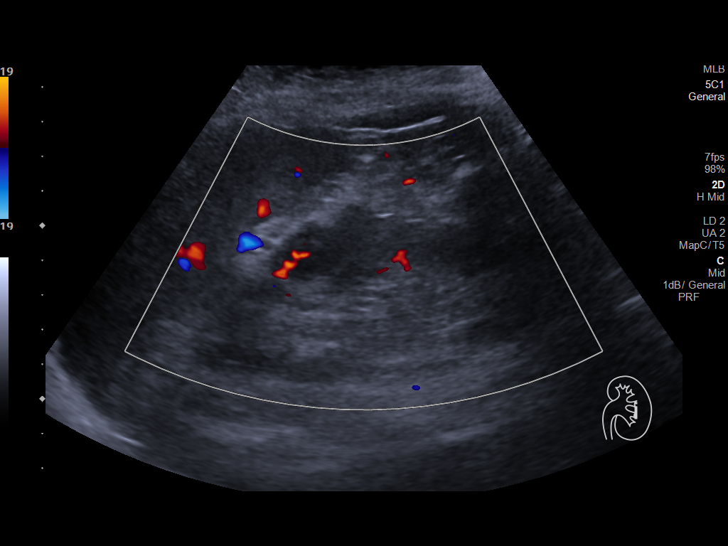
[im 21/31]
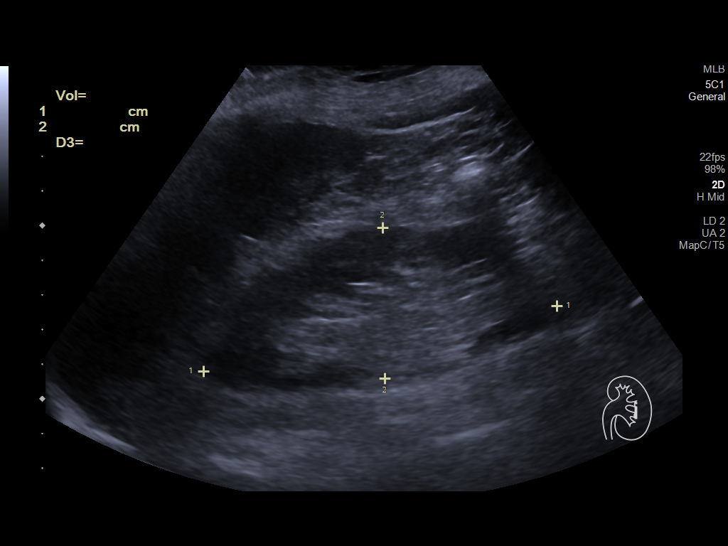
[im 23/31]
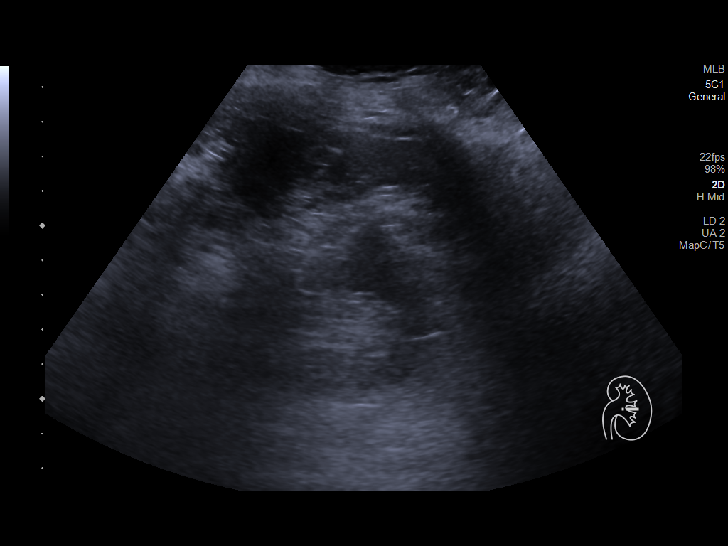
[im 26/31]
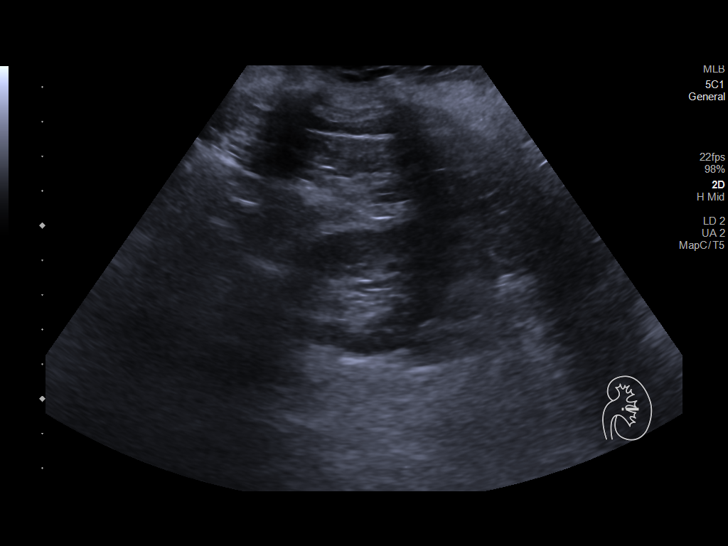
[im 28/31]
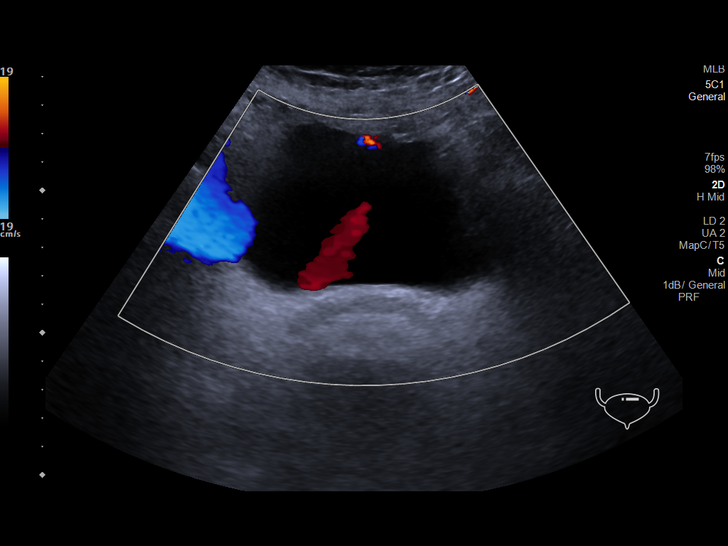
[im 31/31]
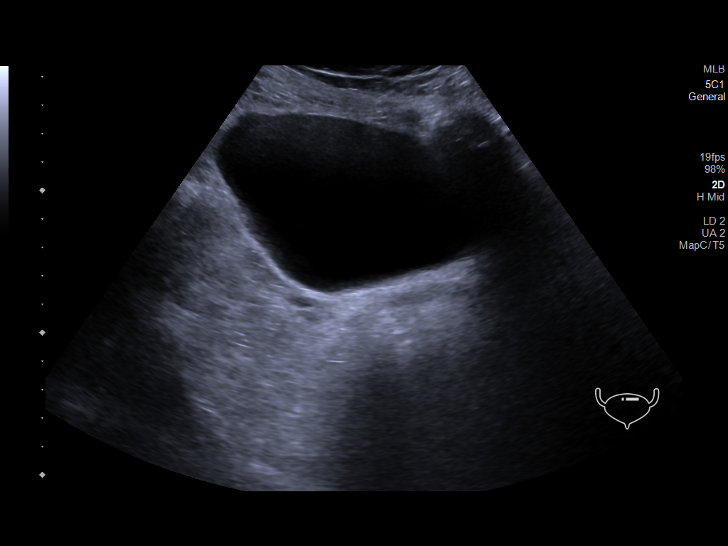

[14 of 25 positions shown; findings below may reference images not displayed]

FINDINGS: Right Kidney:

Renal measurements: 9.8 x 4.8 x 5.0 cm = volume: Is 122.3 mL .
Echogenicity within normal limits. A solid mass lesion measuring
x 7.08.2 cm is identified. Right renal stone is also seen measuring
approximately 2.2 cm in diameter.

Left Kidney:

Renal measurements: 10.4 x 4.5 x 3.3 cm = volume: 77.7 mL.
Echogenicity within normal limits. No mass or hydronephrosis
visualized.

Bladder:

Appears normal for degree of bladder distention.
IMPRESSION: 10 cm in diameter solid mass lesion in the right kidney. MRI of the
kidneys with and without contrast recommended for further
evaluation.

Large nonobstructing right renal stone.

These results will be called to the ordering clinician or
representative by the Radiologist Assistant, and communication
documented in the PACS or zVision Dashboard.

## 2023-01-27 DEATH — deceased
